# Patient Record
Sex: Male | Born: 1990 | Race: Black or African American | Hispanic: No | Marital: Single | Smoking: Former smoker
Health system: Southern US, Community
[De-identification: ages and names within clinical notes are randomized; demographics above are authoritative.]

## PROBLEM LIST (undated history)

## (undated) ENCOUNTER — Emergency Department (HOSPITAL_COMMUNITY): Admission: EM | Payer: Self-pay | Source: Home / Self Care

## (undated) DIAGNOSIS — S0990XA Unspecified injury of head, initial encounter: Secondary | ICD-10-CM

## (undated) DIAGNOSIS — R569 Unspecified convulsions: Secondary | ICD-10-CM

## (undated) DIAGNOSIS — I10 Essential (primary) hypertension: Secondary | ICD-10-CM

## (undated) DIAGNOSIS — J45909 Unspecified asthma, uncomplicated: Secondary | ICD-10-CM

---
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
## 2008-06-17 ENCOUNTER — Emergency Department (HOSPITAL_COMMUNITY): Admission: EM | Admit: 2008-06-17 | Discharge: 2008-06-17 | Payer: Self-pay | Admitting: Emergency Medicine

## 2009-09-06 ENCOUNTER — Emergency Department (HOSPITAL_COMMUNITY): Admission: EM | Admit: 2009-09-06 | Discharge: 2009-09-06 | Payer: Self-pay | Admitting: Emergency Medicine

## 2009-11-25 ENCOUNTER — Emergency Department (HOSPITAL_COMMUNITY): Admission: EM | Admit: 2009-11-25 | Discharge: 2009-11-25 | Payer: Self-pay | Admitting: Emergency Medicine

## 2010-01-21 ENCOUNTER — Emergency Department (HOSPITAL_COMMUNITY): Admission: EM | Admit: 2010-01-21 | Discharge: 2010-01-21 | Payer: Self-pay | Admitting: Emergency Medicine

## 2010-01-23 ENCOUNTER — Emergency Department (HOSPITAL_COMMUNITY): Admission: EM | Admit: 2010-01-23 | Discharge: 2010-01-23 | Payer: Self-pay | Admitting: Emergency Medicine

## 2010-03-15 ENCOUNTER — Emergency Department (HOSPITAL_COMMUNITY): Admission: EM | Admit: 2010-03-15 | Discharge: 2010-03-15 | Payer: Self-pay | Admitting: Emergency Medicine

## 2010-06-19 ENCOUNTER — Emergency Department (HOSPITAL_COMMUNITY): Admission: EM | Admit: 2010-06-19 | Discharge: 2010-06-20 | Payer: Self-pay | Admitting: Emergency Medicine

## 2011-01-17 LAB — GC/CHLAMYDIA PROBE AMP, GENITAL
Chlamydia, DNA Probe: NEGATIVE
GC Probe Amp, Genital: NEGATIVE

## 2012-02-09 ENCOUNTER — Encounter (HOSPITAL_COMMUNITY): Payer: Self-pay | Admitting: *Deleted

## 2012-02-09 ENCOUNTER — Emergency Department (INDEPENDENT_AMBULATORY_CARE_PROVIDER_SITE_OTHER)
Admission: EM | Admit: 2012-02-09 | Discharge: 2012-02-09 | Disposition: A | Discharge status: Home / Self Care | Payer: Self-pay | Source: Home / Self Care | Attending: Family Medicine | Admitting: Family Medicine

## 2012-02-09 DIAGNOSIS — M79609 Pain in unspecified limb: Secondary | ICD-10-CM

## 2012-02-09 DIAGNOSIS — M79606 Pain in leg, unspecified: Secondary | ICD-10-CM

## 2012-02-09 DIAGNOSIS — Z041 Encounter for examination and observation following transport accident: Secondary | ICD-10-CM

## 2012-02-09 MED ORDER — IBUPROFEN 800 MG PO TABS: 800.0000 mg | ORAL_TABLET | Freq: Three times a day (TID) | ORAL | Status: AC
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
## 2012-02-09 NOTE — Discharge Instructions (Signed)
Heat to thigh and medicine as needed, activity as tolerated. Return if needed.
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
## 2012-07-17 ENCOUNTER — Encounter (HOSPITAL_COMMUNITY): Payer: Self-pay

## 2012-07-17 ENCOUNTER — Emergency Department (INDEPENDENT_AMBULATORY_CARE_PROVIDER_SITE_OTHER)
Admission: EM | Admit: 2012-07-17 | Discharge: 2012-07-17 | Disposition: A | Discharge status: Home / Self Care | Payer: Self-pay | Source: Home / Self Care | Attending: Emergency Medicine | Admitting: Emergency Medicine

## 2012-07-17 DIAGNOSIS — K0510 Chronic gingivitis, plaque induced: Secondary | ICD-10-CM

## 2012-07-17 DIAGNOSIS — K0889 Other specified disorders of teeth and supporting structures: Secondary | ICD-10-CM

## 2012-07-17 DIAGNOSIS — K089 Disorder of teeth and supporting structures, unspecified: Secondary | ICD-10-CM

## 2012-07-17 MED ORDER — CLINDAMYCIN HCL 150 MG PO CAPS: 150.0000 mg | ORAL_CAPSULE | Freq: Three times a day (TID) | ORAL | Status: AC

## 2012-07-17 MED ORDER — KETOROLAC TROMETHAMINE 10 MG PO TABS: 10.0000 mg | ORAL_TABLET | Freq: Four times a day (QID) | ORAL | Status: DC | PRN

## 2012-07-17 NOTE — ED Provider Notes (Signed)
History     CSN: [id number]  Arrival date & time 07/17/12  1549   First MD Initiated Contact with Patient 07/17/12 1550      Chief Complaint  Patient presents with  . Dental Pain    (Consider location/radiation/quality/duration/timing/severity/associated sxs/prior treatment) HPI Comments: Patient presents urgent care complaining of toothache since yesterday. (Points right upper molar region). Describes he does not have a  dentist. Denies any facial swelling fevers or headaches. A throbbing type pain. Have not taking anything over-the-counter for it.  Patient is a 21 y.o. male presenting with tooth pain. The history is provided by the patient.  Dental PainThe primary symptoms include mouth pain. Primary symptoms do not include dental injury, fever, sore throat or angioedema. The symptoms began yesterday. The symptoms are worsening. The symptoms occur constantly.  Additional symptoms do not include: dental sensitivity to temperature, gum swelling, jaw pain, facial swelling, trouble swallowing, pain with swallowing, dry mouth, drooling, ear pain and fatigue. Medical issues include: periodontal disease. Medical issues do not include: cancer.    History reviewed. No pertinent past medical history.  History reviewed. No pertinent past surgical history.  History reviewed. No pertinent family history.  History  Substance Use Topics  . Smoking status: Current Every Day Smoker  . Smokeless tobacco: Not on file  . Alcohol Use: Yes      Review of Systems  Constitutional: Negative for fever, activity change, appetite change and fatigue.  HENT: Positive for dental problem. Negative for ear pain, sore throat, facial swelling, drooling, trouble swallowing, neck pain, neck stiffness and voice change.     Allergies  Ibuprofen; Penicillins; and Tylenol  Home Medications   Current Outpatient Rx  Name Route Sig Dispense Refill  . CLINDAMYCIN HCL 150 MG PO CAPS Oral Take 1 capsule (150 mg  total) by mouth 3 (three) times daily. 28 capsule 0  . KETOROLAC TROMETHAMINE 10 MG PO TABS Oral Take 1 tablet (10 mg total) by mouth every 6 (six) hours as needed for pain. 15 tablet 0    BP 135/84  Pulse 100  Temp 98.6 F (37 C) (Oral)  Resp 16  SpO2 98%  Physical Exam  Nursing note and vitals reviewed. Constitutional: He appears well-developed and well-nourished.  HENT:  Head: Normocephalic.  Mouth/Throat: Oropharynx is clear and moist and mucous membranes are normal. No oropharyngeal exudate, posterior oropharyngeal edema, posterior oropharyngeal erythema or tonsillar abscesses.  Eyes: Conjunctivae normal are normal.  Neck: Neck supple.    ED Course  Procedures (including critical care time)  Labs Reviewed - No data to display No results found.   1. Pain, dental   2. Gingivitis       MDM  Patient on exam exhibited signs of periodontal disease and gingivitis but no obvious apical abscess. Have referred him to see the dentist prescribed for a little along with clindamycin.       Jimmie Molly, MD 07/17/12 1721

## 2012-07-17 NOTE — ED Notes (Signed)
C/o tooth pain and headache on right side

## 2012-08-09 ENCOUNTER — Emergency Department (HOSPITAL_COMMUNITY)
Admission: EM | Admit: 2012-08-09 | Discharge: 2012-08-09 | Disposition: A | Discharge status: Home / Self Care | Payer: Self-pay | Attending: Emergency Medicine | Admitting: Emergency Medicine

## 2012-08-09 ENCOUNTER — Encounter (HOSPITAL_COMMUNITY): Payer: Self-pay | Admitting: *Deleted

## 2012-08-09 DIAGNOSIS — K0889 Other specified disorders of teeth and supporting structures: Secondary | ICD-10-CM

## 2012-08-09 DIAGNOSIS — F17200 Nicotine dependence, unspecified, uncomplicated: Secondary | ICD-10-CM | POA: Insufficient documentation

## 2012-08-09 DIAGNOSIS — R51 Headache: Secondary | ICD-10-CM | POA: Insufficient documentation

## 2012-08-09 DIAGNOSIS — K089 Disorder of teeth and supporting structures, unspecified: Secondary | ICD-10-CM | POA: Insufficient documentation

## 2012-08-09 DIAGNOSIS — H9209 Otalgia, unspecified ear: Secondary | ICD-10-CM | POA: Insufficient documentation

## 2012-08-09 MED ORDER — IBUPROFEN 600 MG PO TABS: 600.0000 mg | ORAL_TABLET | Freq: Four times a day (QID) | ORAL | Status: DC | PRN

## 2012-08-09 MED ORDER — HYDROCODONE-ACETAMINOPHEN 5-325 MG PO TABS
1.0000 | ORAL_TABLET | Freq: Once | ORAL | Status: AC
  Administered 2012-08-09: 1 via ORAL
  Filled 2012-08-09: qty 1

## 2012-08-09 MED ORDER — HYDROCODONE-ACETAMINOPHEN 5-325 MG PO TABS: ORAL_TABLET | ORAL | Status: DC

## 2012-08-09 MED ORDER — AMOXICILLIN 500 MG PO CAPS: 500.0000 mg | ORAL_CAPSULE | Freq: Three times a day (TID) | ORAL | Status: DC

## 2012-08-09 NOTE — ED Provider Notes (Signed)
History     CSN: [id number]  Arrival date & time 08/09/12  0006   First MD Initiated Contact with Patient 08/09/12 0028      Chief Complaint  Patient presents with  . Dental Pain    (Consider location/radiation/quality/duration/timing/severity/associated sxs/prior treatment) HPI Comments: Patient presents with complaint of right upper molar pain for the past 3-4 days. Patient states that the pain worsened yesterday. He has had similar pain over the past month. He was seen at urgent care and prescribed clindamycin which he was unable to fill 2/2 cost. Patient has a penicillin allergy but has taken amoxicillin in the past without problem. He denies facial swelling or fever. No neck pain, swelling, trouble swallowing. Onset gradual. Course is constant. Patient has taken ibuprofen which has helped temporarily. Nothing makes symptoms worse. Pain radiates to right ear.  Patient is a 21 y.o. male presenting with tooth pain.  Dental PainThe primary symptoms include headaches. Primary symptoms do not include fever, shortness of breath, sore throat or cough.  Additional symptoms include: ear pain. Additional symptoms do not include: facial swelling and trouble swallowing.    History reviewed. No pertinent past medical history.  History reviewed. No pertinent past surgical history.  History reviewed. No pertinent family history.  History  Substance Use Topics  . Smoking status: Current Every Day Smoker    Types: Cigarettes  . Smokeless tobacco: Not on file  . Alcohol Use: Yes      Review of Systems  Constitutional: Negative for fever.  HENT: Positive for ear pain and dental problem. Negative for sore throat, facial swelling, rhinorrhea, trouble swallowing and neck pain.   Respiratory: Negative for cough, shortness of breath and stridor.   Gastrointestinal: Negative for nausea and vomiting.  Skin: Negative for color change and rash.  Neurological: Positive for headaches.     Allergies  Penicillins  Home Medications   Current Outpatient Rx  Name Route Sig Dispense Refill  . ACETAMINOPHEN 500 MG PO TABS Oral Take 1,000 mg by mouth every 6 (six) hours as needed. For pain    . AMOXICILLIN 500 MG PO CAPS Oral Take 1 capsule (500 mg total) by mouth 3 (three) times daily. 21 capsule 0  . HYDROCODONE-ACETAMINOPHEN 5-325 MG PO TABS  Take 1-2 tablets every 6 hours as needed for severe pain 12 tablet 0  . IBUPROFEN 600 MG PO TABS Oral Take 1 tablet (600 mg total) by mouth every 6 (six) hours as needed for pain. 20 tablet 0    BP 148/80  Pulse 83  Temp 97.9 F (36.6 C) (Oral)  Resp 20  SpO2 99%  Physical Exam  Nursing note and vitals reviewed. Constitutional: He appears well-developed and well-nourished.  HENT:  Head: Normocephalic and atraumatic. No trismus in the jaw.  Right Ear: Tympanic membrane, external ear and ear canal normal.  Left Ear: Tympanic membrane, external ear and ear canal normal.  Nose: Nose normal.  Mouth/Throat: Uvula is midline, oropharynx is clear and moist and mucous membranes are normal. Abnormal dentition. Dental caries present. No dental abscesses or uvula swelling. No tonsillar abscesses.         Significant periodontal disease with multiple caries. No swelling or erythema noted on exam.  Eyes: Pupils are equal, round, and reactive to light.  Neck: Normal range of motion. Neck supple.       No neck swelling or Lugwig's angina  Neurological: He is alert.  Skin: Skin is warm and dry.  Psychiatric: He has a  normal mood and affect.    ED Course  Procedures (including critical care time)  Labs Reviewed - No data to display No results found.   1. Pain, dental    12:59 AM Patient seen and examined. Work-up initiated. Medications ordered.   Vital signs reviewed and are as follows: Filed Vitals:   08/09/12 0010  BP: 148/80  Pulse: 83  Temp: 97.9 F (36.6 C)  Resp: 20    Patient counseled to take prescribed  medications as directed, return with worsening facial or neck swelling, and to follow-up with their dentist as soon as possible.   Patient counseled on use of narcotic pain medications. Counseled not to combine these medications with others containing tylenol. Urged not to drink alcohol, drive, or perform any other activities that requires focus while taking these medications. The patient verbalizes understanding and agrees with the plan.    MDM  Patient with toothache.  No gross abscess.  Exam unconcerning for Ludwig's angina or other deep tissue infection in neck.  Will treat with penicillin and pain medicine.  Urged patient to follow-up with dentist.           Renne Crigler, PA 08/09/12 [phone]

## 2012-08-09 NOTE — ED Provider Notes (Signed)
Medical screening examination/treatment/procedure(s) were performed by non-physician practitioner and as supervising physician I was immediately available for consultation/collaboration.  Miasia Crabtree, MD 08/09/12 0254 

## 2012-08-09 NOTE — ED Notes (Signed)
Pt c/o dental pain in upper right jaw x 1 week, worsening last night at about 2000, denies relief from OTC tylenol.

## 2013-03-03 ENCOUNTER — Encounter (HOSPITAL_COMMUNITY): Payer: Self-pay

## 2013-03-03 ENCOUNTER — Emergency Department (HOSPITAL_COMMUNITY)
Admission: EM | Admit: 2013-03-03 | Discharge: 2013-03-03 | Disposition: A | Discharge status: Home / Self Care | Payer: Self-pay | Attending: Emergency Medicine | Admitting: Emergency Medicine

## 2013-03-03 DIAGNOSIS — K0889 Other specified disorders of teeth and supporting structures: Secondary | ICD-10-CM

## 2013-03-03 DIAGNOSIS — K089 Disorder of teeth and supporting structures, unspecified: Secondary | ICD-10-CM | POA: Insufficient documentation

## 2013-03-03 DIAGNOSIS — F17200 Nicotine dependence, unspecified, uncomplicated: Secondary | ICD-10-CM | POA: Insufficient documentation

## 2013-03-03 MED ORDER — HYDROCODONE-ACETAMINOPHEN 5-325 MG PO TABS: 1.0000 | ORAL_TABLET | Freq: Four times a day (QID) | ORAL | Status: DC | PRN

## 2013-03-03 MED ORDER — CLINDAMYCIN HCL 300 MG PO CAPS
300.0000 mg | ORAL_CAPSULE | Freq: Once | ORAL | Status: AC
  Administered 2013-03-03: 300 mg via ORAL
  Filled 2013-03-03: qty 1

## 2013-03-03 MED ORDER — CLINDAMYCIN HCL 150 MG PO CAPS: 150.0000 mg | ORAL_CAPSULE | Freq: Three times a day (TID) | ORAL | Status: DC

## 2013-03-03 MED ORDER — HYDROCODONE-ACETAMINOPHEN 5-325 MG PO TABS
1.0000 | ORAL_TABLET | Freq: Once | ORAL | Status: AC
  Administered 2013-03-03: 1 via ORAL
  Filled 2013-03-03: qty 1

## 2013-03-03 NOTE — ED Provider Notes (Signed)
History     CSN: [id number]  Arrival date & time 03/03/13  0157   First MD Initiated Contact with Patient 03/03/13 0204      Chief Complaint  Patient presents with  . Dental Pain    (Consider location/radiation/quality/duration/timing/severity/associated sxs/prior treatment) HPI Is a 22 year old male with a two-day history of pain in his right maxilla. He attributes this to her dental problems although he cannot point to a specific tooth. He says it hurts to chew on that side shows been eating primarily on the left side of his mouth. The pain is radiating to his right temple and a throbbing. The pain is moderate to severe, worse lying down. He denies cervical lymphadenopathy. He denies fever. He does not have a dentist.  History reviewed. No pertinent past medical history.  History reviewed. No pertinent past surgical history.  No family history on file.  History  Substance Use Topics  . Smoking status: Current Every Day Smoker    Types: Cigarettes  . Smokeless tobacco: Not on file  . Alcohol Use: Yes      Review of Systems  All other systems reviewed and are negative.    Allergies  Penicillins  Home Medications   Current Outpatient Rx  Name  Route  Sig  Dispense  Refill  . acetaminophen (TYLENOL) 500 MG tablet   Oral   Take 1,000 mg by mouth every 6 (six) hours as needed. For pain         . ibuprofen (ADVIL,MOTRIN) 200 MG tablet   Oral   Take 400 mg by mouth every 6 (six) hours as needed for pain.           BP 138/78  Pulse 90  Temp(Src) 97.9 F (36.6 C) (Oral)  Resp 18  Ht 5\' 7"  (1.702 m)  Wt 245 lb (111.131 kg)  BMI 38.36 kg/m2  SpO2 98%  Physical Exam General: Well-developed, well-nourished male in no acute distress; appearance consistent with age of record HENT: normocephalic, atraumatic; multiple dental caries; right maxilla tender to percussion without edema Eyes: pupils equal round and reactive to light; extraocular muscles  intact Neck: supple; no cervical lymphadenopathy Heart: regular rate and rhythm Lungs: Normal respiratory effort and excursion Abdomen: soft; nondistended Extremities: No deformity; full range of motion Neurologic: Awake, alert and oriented; motor function intact in all extremities and symmetric; no facial droop Skin: Warm and dry Psychiatric: Normal mood and affect    ED Course  Procedures (including critical care time)     MDM  Patient advised that he needs to contact his dentist on call within 48 hours.        Hanley Seamen, MD 03/03/13 [phone]

## 2013-03-03 NOTE — Discharge Instructions (Signed)

## 2013-03-03 NOTE — ED Notes (Signed)
Pt /o of R side dental pain that has gotten worse over the last 2 days. Pain moves to the R side of the head causing head to throb. No dental visit in over a year.

## 2013-04-02 ENCOUNTER — Encounter (HOSPITAL_COMMUNITY): Payer: Self-pay | Admitting: *Deleted

## 2013-04-02 ENCOUNTER — Emergency Department (HOSPITAL_COMMUNITY)
Admission: EM | Admit: 2013-04-02 | Discharge: 2013-04-02 | Disposition: A | Discharge status: Home / Self Care | Payer: Self-pay | Attending: Emergency Medicine | Admitting: Emergency Medicine

## 2013-04-02 DIAGNOSIS — F17200 Nicotine dependence, unspecified, uncomplicated: Secondary | ICD-10-CM | POA: Insufficient documentation

## 2013-04-02 DIAGNOSIS — A630 Anogenital (venereal) warts: Secondary | ICD-10-CM | POA: Insufficient documentation

## 2013-04-02 DIAGNOSIS — Z880 Allergy status to penicillin: Secondary | ICD-10-CM | POA: Insufficient documentation

## 2013-04-02 DIAGNOSIS — L293 Anogenital pruritus, unspecified: Secondary | ICD-10-CM | POA: Insufficient documentation

## 2013-04-02 DIAGNOSIS — Z202 Contact with and (suspected) exposure to infections with a predominantly sexual mode of transmission: Secondary | ICD-10-CM | POA: Insufficient documentation

## 2013-04-02 MED ORDER — METRONIDAZOLE 500 MG PO TABS
2000.0000 mg | ORAL_TABLET | Freq: Once | ORAL | Status: AC
  Administered 2013-04-02: 2000 mg via ORAL
  Filled 2013-04-02: qty 4

## 2013-04-02 NOTE — ED Provider Notes (Signed)
History    This chart was scribed for Spencer Davis, non-physician practitioner working with Flint Melter, MD by Leone Payor, ED Scribe. This patient was seen in room TR11C/TR11C and the patient's care was started at 1930.   CSN: [id number]  Arrival date & time 04/02/13  7829   First MD Initiated Contact with Patient 04/02/13 1938      Chief Complaint  Patient presents with  . Exposure to STD     The history is provided by the patient. No language interpreter was used.    HPI Comments: Spencer Davis is a 22 y.o. male who presents to the Emergency Department requesting to be checked for a possible STD. States his girlfriend recently told him she was diagnosed with trichomonas. Per pt, girlfriend stated she did not have any other sexual partners so pt wants to be evaluated. Pt states his current girlfriend is his second sexual partner. Reports he used a condom with her the first time they had intercourse but has not used any since then with her. He has noticed some penile itching. He denies penile discharge, dysuria, testicular pain, penile pain, nausea, vomiting, abdominal pain, fevers, chills, sweating.    History reviewed. No pertinent past medical history.  History reviewed. No pertinent past surgical history.  History reviewed. No pertinent family history.  History  Substance Use Topics  . Smoking status: Current Every Day Smoker    Types: Cigarettes  . Smokeless tobacco: Not on file  . Alcohol Use: Yes      Review of Systems  Constitutional: Negative for fever, chills and diaphoresis.  Gastrointestinal: Negative for nausea and vomiting.  Genitourinary: Negative for dysuria, discharge, penile swelling, scrotal swelling, penile pain and testicular pain.    Allergies  Penicillins  Home Medications   Current Outpatient Rx  Name  Route  Sig  Dispense  Refill  . ibuprofen (ADVIL,MOTRIN) 200 MG tablet   Oral   Take 400 mg by mouth every 6 (six) hours as needed for  pain.           BP 160/104  Pulse 88  Temp(Src) 99.3 F (37.4 C) (Oral)  Resp 18  SpO2 99%  Physical Exam  Nursing note and vitals reviewed. Constitutional: He appears well-developed and well-nourished. No distress.  HENT:  Head: Normocephalic and atraumatic.  Neck: Neck supple.  Pulmonary/Chest: Effort normal.  Abdominal: Soft. He exhibits no distension. There is no tenderness. There is no rebound and no guarding.  Genitourinary: Testes normal. Right testis shows no mass, no swelling and no tenderness. Right testis is descended. Left testis shows no mass, no swelling and no tenderness. Left testis is descended. Circumcised. No penile tenderness.  Neurological: He is alert.  Skin: He is not diaphoretic.    ED Course  Procedures (including critical care time)  DIAGNOSTIC STUDIES: Oxygen Saturation is 99% on RA, normal by my interpretation.    COORDINATION OF CARE: 9:22 PM Discussed treatment plan with pt at bedside and pt agreed to plan.   Labs Reviewed  GC/CHLAMYDIA PROBE AMP  RPR   No results found.   1. Exposure to STD   2. Genital warts      MDM  Pt with exposure to trichomonas, also found to have genital warts on exam. Pt educated about HPV.  Pt decided to have full STD testing including GC/Chlam, syphilis testing.   Pt is hypertensive but denies CP, SOB, HA.  No AMS.  Now 152/86.  Treated for trichomonas.  Discussed findings,  treatment, follow up  with patient.  Pt given return precautions.  Pt verbalizes understanding and agrees with plan.        I personally performed the services described in this documentation, which was scribed in my presence. The recorded information has been reviewed and is accurate.   Spencer Dredge, PA-C 04/02/13 2255
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
## 2013-04-03 LAB — GC/CHLAMYDIA PROBE AMP: GC Probe RNA: NEGATIVE

## 2013-04-03 LAB — RPR: RPR Ser Ql: NONREACTIVE

## 2013-04-03 NOTE — ED Provider Notes (Signed)
Medical screening examination/treatment/procedure(s) were performed by non-physician practitioner and as supervising physician I was immediately available for consultation/collaboration.  Dhruti Ghuman L Shaila Gilchrest, MD 04/03/13 0048 

## 2013-07-04 ENCOUNTER — Encounter (HOSPITAL_COMMUNITY): Payer: Self-pay | Admitting: Emergency Medicine

## 2013-07-04 ENCOUNTER — Emergency Department (HOSPITAL_COMMUNITY)
Admission: EM | Admit: 2013-07-04 | Discharge: 2013-07-04 | Disposition: A | Discharge status: Home / Self Care | Payer: Self-pay | Attending: Emergency Medicine | Admitting: Emergency Medicine

## 2013-07-04 DIAGNOSIS — K029 Dental caries, unspecified: Secondary | ICD-10-CM | POA: Insufficient documentation

## 2013-07-04 DIAGNOSIS — F17200 Nicotine dependence, unspecified, uncomplicated: Secondary | ICD-10-CM | POA: Insufficient documentation

## 2013-07-04 MED ORDER — TRAMADOL HCL 50 MG PO TABS
50.0000 mg | ORAL_TABLET | Freq: Once | ORAL | Status: AC
  Administered 2013-07-04: 50 mg via ORAL
  Filled 2013-07-04: qty 1

## 2013-07-04 MED ORDER — TRAMADOL HCL 50 MG PO TABS: 50.0000 mg | ORAL_TABLET | Freq: Four times a day (QID) | ORAL | Status: DC | PRN

## 2013-07-04 NOTE — ED Provider Notes (Signed)
Medical screening examination/treatment/procedure(s) were performed by non-physician practitioner and as supervising physician I was immediately available for consultation/collaboration.   Daiton Cowles M Osa Campoli, DO 07/04/13 0835 

## 2013-07-04 NOTE — ED Provider Notes (Signed)
CSN: [id number]     Arrival date & time 07/04/13  0119 History   First MD Initiated Contact with Patient 07/04/13 0221     Chief Complaint  Patient presents with  . Dental Pain   (Consider location/radiation/quality/duration/timing/severity/associated sxs/prior Treatment) HPI Comments: Patient reports, that he's had gradual onset of worsening.  Dental pain.  He has large cavities in his right upper and lower second molar without surrounding gum  Patient is a 22 y.o. male presenting with tooth pain. The history is provided by the patient.  Dental Pain Location:  Upper and lower Upper teeth location:  2/RU 2nd molar Lower teeth location:  31/RL 2nd molar Quality:  Aching Severity:  Moderate Onset quality:  Gradual Timing:  Constant Progression:  Worsening Chronicity:  New Relieved by:  None tried Worsened by:  Cold food/drink Ineffective treatments:  None tried Associated symptoms: no drooling, no facial pain, no facial swelling, no fever, no neck swelling, no oral lesions and no trismus     History reviewed. No pertinent past medical history. History reviewed. No pertinent past surgical history. No family history on file. History  Substance Use Topics  . Smoking status: Current Every Day Smoker    Types: Cigarettes  . Smokeless tobacco: Not on file  . Alcohol Use: Yes    Review of Systems  Constitutional: Negative for fever.  HENT: Positive for dental problem. Negative for facial swelling, drooling and mouth sores.   All other systems reviewed and are negative.    Allergies  Penicillins  Home Medications   Current Outpatient Rx  Name  Route  Sig  Dispense  Refill  . ibuprofen (ADVIL,MOTRIN) 200 MG tablet   Oral   Take 400 mg by mouth every 6 (six) hours as needed for pain.         . traMADol (ULTRAM) 50 MG tablet   Oral   Take 1 tablet (50 mg total) by mouth every 6 (six) hours as needed for pain.   15 tablet   0    BP 127/95  Pulse 96  Temp(Src) 98.3  F (36.8 C) (Oral)  Resp 16  SpO2 99% Physical Exam  Nursing note and vitals reviewed. Constitutional: He is oriented to person, place, and time. He appears well-developed and well-nourished.  HENT:  Head: Normocephalic and atraumatic.  Right Ear: External ear normal.  Left Ear: External ear normal.  Mouth/Throat: Uvula is midline and oropharynx is clear and moist.    Neck: Normal range of motion.  Cardiovascular: Normal rate and regular rhythm.   Pulmonary/Chest: Effort normal and breath sounds normal.  Abdominal: Soft.  Musculoskeletal: Normal range of motion.  Lymphadenopathy:    He has no cervical adenopathy.  Neurological: He is alert and oriented to person, place, and time.  Skin: No rash noted.    ED Course  Procedures (including critical care time) Labs Review Labs Reviewed - No data to display Imaging Review No results found.  MDM   1. Dental cavity    Will treat with Ultram, and refer patient to the free clinic, but will be held and [facility], September 26 and 27th    Arman Filter, NP 07/04/13 0235  Arman Filter, NP 07/04/13 0236

## 2013-07-04 NOTE — ED Notes (Signed)
Pt. reports progressing right upper molar pain for 1 week unrelieved by OTC Ibuprofen .

## 2013-07-06 ENCOUNTER — Encounter (HOSPITAL_COMMUNITY): Payer: Self-pay | Admitting: *Deleted

## 2013-07-06 ENCOUNTER — Emergency Department (HOSPITAL_COMMUNITY)
Admission: EM | Admit: 2013-07-06 | Discharge: 2013-07-06 | Disposition: A | Discharge status: Home / Self Care | Payer: Self-pay | Attending: Emergency Medicine | Admitting: Emergency Medicine

## 2013-07-06 DIAGNOSIS — F17200 Nicotine dependence, unspecified, uncomplicated: Secondary | ICD-10-CM | POA: Insufficient documentation

## 2013-07-06 DIAGNOSIS — Z880 Allergy status to penicillin: Secondary | ICD-10-CM | POA: Insufficient documentation

## 2013-07-06 DIAGNOSIS — E669 Obesity, unspecified: Secondary | ICD-10-CM | POA: Insufficient documentation

## 2013-07-06 DIAGNOSIS — K089 Disorder of teeth and supporting structures, unspecified: Secondary | ICD-10-CM | POA: Insufficient documentation

## 2013-07-06 DIAGNOSIS — Z792 Long term (current) use of antibiotics: Secondary | ICD-10-CM | POA: Insufficient documentation

## 2013-07-06 DIAGNOSIS — K0889 Other specified disorders of teeth and supporting structures: Secondary | ICD-10-CM

## 2013-07-06 MED ORDER — METRONIDAZOLE 500 MG PO TABS: 500.0000 mg | ORAL_TABLET | Freq: Two times a day (BID) | ORAL | Status: DC

## 2013-07-06 MED ORDER — MORPHINE SULFATE 4 MG/ML IJ SOLN
4.0000 mg | Freq: Once | INTRAMUSCULAR | Status: AC
  Administered 2013-07-06: 4 mg via INTRAMUSCULAR
  Filled 2013-07-06: qty 1

## 2013-07-06 MED ORDER — OXYCODONE-ACETAMINOPHEN 5-325 MG PO TABS: ORAL_TABLET | ORAL | Status: DC

## 2013-07-06 NOTE — ED Notes (Signed)
Pt reports seen at [facility] on 9/20 for same c/o of right upper tooth pain 10/10. Reports rx tramadol, but with no pain relief. Reports right sided earache and head pain now.

## 2013-07-06 NOTE — ED Notes (Signed)
Bed: WA01 Expected date:  Expected time:  Means of arrival:  Comments: 

## 2013-07-06 NOTE — Progress Notes (Signed)
[facility] provided pt with a list of primary care resources, dental resources, and information on the free dental clinic at the end of the month.

## 2013-07-06 NOTE — ED Provider Notes (Signed)
CSN: [id number]     Arrival date & time 07/06/13  1050 History   First MD Initiated Contact with Patient 07/06/13 1112     Chief Complaint  Patient presents with  . Dental Pain   (Consider location/radiation/quality/duration/timing/severity/associated sxs/prior Treatment) HPI  Spencer Davis is a 22 y.o. male complaining of pain to right tooth, cannot localize this is upper lower. Pain is severe, 10 out of 10, exacerbated by chewing. Patient denies fever, trismus, nausea vomiting, facial swelling, gum swelling or discharge.  History reviewed. No pertinent past medical history. History reviewed. No pertinent past surgical history. History reviewed. No pertinent family history. History  Substance Use Topics  . Smoking status: Current Every Day Smoker    Types: Cigarettes  . Smokeless tobacco: Not on file  . Alcohol Use: Yes    Review of Systems  Allergies  Penicillins  Home Medications   Current Outpatient Rx  Name  Route  Sig  Dispense  Refill  . ibuprofen (ADVIL,MOTRIN) 200 MG tablet   Oral   Take 200-400 mg by mouth every 6 (six) hours as needed for pain.          . traMADol (ULTRAM) 50 MG tablet   Oral   Take 1 tablet (50 mg total) by mouth every 6 (six) hours as needed for pain.   15 tablet   0   . metroNIDAZOLE (FLAGYL) 500 MG tablet   Oral   Take 1 tablet (500 mg total) by mouth 2 (two) times daily. One tab PO bid x 10 days   20 tablet   0   . oxyCODONE-acetaminophen (PERCOCET/ROXICET) 5-325 MG per tablet      1 to 2 tabs PO q6hrs  PRN for pain   15 tablet   0    BP 156/83  Pulse 92  Temp(Src) 97.6 F (36.4 C) (Oral)  Resp 20  SpO2 100% Physical Exam  Nursing note and vitals reviewed. Constitutional: He is oriented to person, place, and time. He appears well-developed and well-nourished. No distress.  Obese, crying, many facial tattoos, left-sided ankle monitor  HENT:  Head: Normocephalic and atraumatic.  Eyes: Conjunctivae and EOM are normal.  Pupils are equal, round, and reactive to light.  Neck: Normal range of motion.  Cardiovascular: Normal rate, regular rhythm and intact distal pulses.   Pulmonary/Chest: Effort normal and breath sounds normal. No stridor. No respiratory distress. He has no wheezes. He has no rales. He exhibits no tenderness.  Abdominal: Soft. Bowel sounds are normal. He exhibits no distension and no mass. There is no tenderness. There is no rebound and no guarding.  Musculoskeletal: Normal range of motion. He exhibits no edema.  Neurological: He is alert and oriented to person, place, and time.  Psychiatric: He has a normal mood and affect.    ED Course  Procedures (including critical care time) Labs Review Labs Reviewed - No data to display Imaging Review No results found.  MDM   1. Pain, dental    Filed Vitals:   07/06/13 1059 07/06/13 1114 07/06/13 1201  BP: 149/113 141/106 156/83  Pulse: 94 95 92  Temp: 98.1 F (36.7 C) 97.6 F (36.4 C)   TempSrc: Oral Oral   Resp: 16 18 20   SpO2: 100% 100%      Spencer Davis is a 22 y.o. male Patient with toothache.  No gross abscess.  Exam unconcerning for Ludwig's angina or spread of infection.  Will treat with penicillin and pain medicine.  Urged patient to  follow-up with dentist.    Medications  morphine 4 MG/ML injection 4 mg (4 mg Intramuscular Given 07/06/13 1159)   Pt is hemodynamically stable, appropriate for, and amenable to discharge at this time. Pt verbalized understanding and agrees with care plan. All questions answered. Outpatient follow-up and specific return precautions discussed.    Discharge Medication List as of 07/06/2013 11:42 AM    START taking these medications   Details  metroNIDAZOLE (FLAGYL) 500 MG tablet Take 1 tablet (500 mg total) by mouth 2 (two) times daily. One tab PO bid x 10 days, Starting 07/06/2013, Until Discontinued, Print    oxyCODONE-acetaminophen (PERCOCET/ROXICET) 5-325 MG per tablet 1 to 2 tabs PO q6hrs  PRN  for pain, Print        Note: Portions of this report may have been transcribed using voice recognition software. Every effort was made to ensure accuracy; however, inadvertent computerized transcription errors may be present      Wynetta Emery, PA-C 07/06/13 1647

## 2013-07-07 NOTE — ED Provider Notes (Signed)
Medical screening examination/treatment/procedure(s) were performed by non-physician practitioner and as supervising physician I was immediately available for consultation/collaboration.  Destenie Ingber T Jayanth Szczesniak, MD 07/07/13 0709 

## 2016-09-18 ENCOUNTER — Emergency Department (HOSPITAL_COMMUNITY)
Admission: EM | Admit: 2016-09-18 | Discharge: 2016-09-19 | Disposition: A | Discharge status: Home / Self Care | Payer: Self-pay | Attending: Emergency Medicine | Admitting: Emergency Medicine

## 2016-09-18 ENCOUNTER — Encounter (HOSPITAL_COMMUNITY): Payer: Self-pay | Admitting: Emergency Medicine

## 2016-09-18 DIAGNOSIS — R197 Diarrhea, unspecified: Secondary | ICD-10-CM

## 2016-09-18 DIAGNOSIS — R112 Nausea with vomiting, unspecified: Secondary | ICD-10-CM | POA: Insufficient documentation

## 2016-09-18 DIAGNOSIS — F17210 Nicotine dependence, cigarettes, uncomplicated: Secondary | ICD-10-CM | POA: Insufficient documentation

## 2016-09-18 DIAGNOSIS — J45909 Unspecified asthma, uncomplicated: Secondary | ICD-10-CM | POA: Insufficient documentation

## 2016-09-18 DIAGNOSIS — I10 Essential (primary) hypertension: Secondary | ICD-10-CM | POA: Insufficient documentation

## 2016-09-18 HISTORY — DX: Unspecified asthma, uncomplicated: J45.909

## 2016-09-18 HISTORY — DX: Essential (primary) hypertension: I10

## 2016-09-18 LAB — URINALYSIS, ROUTINE W REFLEX MICROSCOPIC
BILIRUBIN URINE: NEGATIVE
GLUCOSE, UA: NEGATIVE mg/dL
Hgb urine dipstick: NEGATIVE
KETONES UR: NEGATIVE mg/dL
Nitrite: NEGATIVE
PH: 6 (ref 5.0–8.0)
Protein, ur: NEGATIVE mg/dL
SPECIFIC GRAVITY, URINE: 1.02 (ref 1.005–1.030)
Squamous Epithelial / LPF: NONE SEEN

## 2016-09-18 LAB — COMPREHENSIVE METABOLIC PANEL
ALK PHOS: 74 U/L (ref 38–126)
ALT: 36 U/L (ref 17–63)
AST: 32 U/L (ref 15–41)
Albumin: 4 g/dL (ref 3.5–5.0)
Anion gap: 14 (ref 5–15)
BILIRUBIN TOTAL: 0.8 mg/dL (ref 0.3–1.2)
BUN: 10 mg/dL (ref 6–20)
CALCIUM: 9.7 mg/dL (ref 8.9–10.3)
CO2: 22 mmol/L (ref 22–32)
CREATININE: 0.86 mg/dL (ref 0.61–1.24)
Chloride: 105 mmol/L (ref 101–111)
Glucose, Bld: 116 mg/dL — ABNORMAL HIGH (ref 65–99)
Potassium: 4 mmol/L (ref 3.5–5.1)
Sodium: 141 mmol/L (ref 135–145)
Total Protein: 6.9 g/dL (ref 6.5–8.1)

## 2016-09-18 LAB — CBC
HCT: 45.1 % (ref 39.0–52.0)
Hemoglobin: 15.7 g/dL (ref 13.0–17.0)
MCH: 30.4 pg (ref 26.0–34.0)
MCHC: 34.8 g/dL (ref 30.0–36.0)
MCV: 87.4 fL (ref 78.0–100.0)
PLATELETS: 235 10*3/uL (ref 150–400)
RBC: 5.16 MIL/uL (ref 4.22–5.81)
RDW: 12.9 % (ref 11.5–15.5)
WBC: 7.8 10*3/uL (ref 4.0–10.5)

## 2016-09-18 LAB — LIPASE, BLOOD: Lipase: 52 U/L — ABNORMAL HIGH (ref 11–51)

## 2016-09-18 NOTE — ED Provider Notes (Signed)
[facility]-EMERGENCY DEPT Provider Note   CSN: [id number] Arrival date & time: 09/18/16  1909  By signing my name below, I, Arianna Nassar, attest that this documentation has been prepared under the direction and in the presence of Shon Batonourtney F Maciah Schweigert, MD.  Electronically Signed: Octavia HeirArianna Nassar, ED Scribe. 09/19/16. 12:39 AM.    History   Chief Complaint Chief Complaint  Patient presents with  . Generalized Body Aches  . Emesis    The history is provided by the patient. No language interpreter was used.   HPI Comments: Spencer Davis is a 25 y.o. male who has a PMhx of HTN presents to the Emergency Department complaining of moderate, gradual worsening generalized body aches x 3 days. He reports associated nausea, vomiting x 3 days, sharp, 6/10 epigastric abdominal pain and mild diarrhea. He notes feeling hot and then having chills intermittently. Pt says that he vomits after eating and his abdominal pain is worse after vomiting. Pt has been taking Nyquil to relieve his symptoms without any relief. He reports having HTN and has not been on medication in the past year. Pt denies hematemesis or blood in stool. Pt is a smoker. He further denies hx of DM or HLD.  Past Medical History:  Diagnosis Date  . Asthma   . Hypertension     There are no active problems to display for this patient.   History reviewed. No pertinent surgical history.     Home Medications    Prior to Admission medications   Medication Sig Start Date End Date Taking? Authorizing Provider  loperamide (IMODIUM) 2 MG capsule Take 1 capsule (2 mg total) by mouth 4 (four) times daily as needed for diarrhea or loose stools. 09/19/16   Shon Batonourtney F Cresencio Reesor, MD  metroNIDAZOLE (FLAGYL) 500 MG tablet Take 1 tablet (500 mg total) by mouth 2 (two) times daily. One tab PO bid x 10 days Patient not taking: Reported on 09/19/2016 07/06/13   Joni ReiningNicole Pisciotta, PA-C  ondansetron (ZOFRAN ODT) 4 MG disintegrating tablet Take 1 tablet (4 mg  total) by mouth every 8 (eight) hours as needed for nausea or vomiting. 09/19/16   Shon Batonourtney F Zaylon Bossier, MD  oxyCODONE-acetaminophen (PERCOCET/ROXICET) 5-325 MG per tablet 1 to 2 tabs PO q6hrs  PRN for pain Patient not taking: Reported on 09/19/2016 07/06/13   Joni ReiningNicole Pisciotta, PA-C  traMADol (ULTRAM) 50 MG tablet Take 1 tablet (50 mg total) by mouth every 6 (six) hours as needed for pain. Patient not taking: Reported on 09/19/2016 07/04/13   Earley FavorGail Schulz, NP    Family History History reviewed. No pertinent family history.  Social History Social History  Substance Use Topics  . Smoking status: Current Every Day Smoker    Packs/day: 0.25    Types: Cigarettes  . Smokeless tobacco: Never Used  . Alcohol use Yes     Allergies   Penicillins   Review of Systems Review of Systems  Constitutional: Positive for chills. Negative for fever.  HENT: Positive for congestion.   Gastrointestinal: Positive for abdominal pain, diarrhea, nausea and vomiting. Negative for blood in stool.  Musculoskeletal: Positive for myalgias.  All other systems reviewed and are negative.    Physical Exam Updated Vital Signs BP 108/64   Pulse 68   Temp 98.9 F (37.2 C) (Oral)   Resp 18   Ht 5\' 7"  (1.702 m)   Wt 262 lb 8 oz (119.1 kg)   SpO2 100%   BMI 41.11 kg/m   Physical Exam  Constitutional: He is  oriented to person, place, and time. He appears well-developed and well-nourished.  HENT:  Head: Normocephalic and atraumatic.  Mouth/Throat: Oropharynx is clear and moist.  Eyes: Pupils are equal, round, and reactive to light.  Neck: Normal range of motion. Neck supple.  Cardiovascular: Normal rate, regular rhythm and normal heart sounds.   No murmur heard. Pulmonary/Chest: Effort normal and breath sounds normal. No respiratory distress. He has no wheezes.  Abdominal: Soft. Bowel sounds are normal. There is no tenderness. There is no rebound.  Musculoskeletal: He exhibits no edema.  Neurological: He is  alert and oriented to person, place, and time.  Skin: Skin is warm and dry.  Psychiatric: He has a normal mood and affect.  Nursing note and vitals reviewed.    ED Treatments / Results  DIAGNOSTIC STUDIES: Oxygen Saturation is 98% on RA, normal by my interpretation.  COORDINATION OF Davis:  12:38 AM Discussed treatment plan with pt at bedside and pt agreed to plan.  Labs (all labs ordered are listed, but only abnormal results are displayed) Labs Reviewed  LIPASE, BLOOD - Abnormal; Notable for the following:       Result Value   Lipase 52 (*)    All other components within normal limits  COMPREHENSIVE METABOLIC PANEL - Abnormal; Notable for the following:    Glucose, Bld 116 (*)    All other components within normal limits  URINALYSIS, ROUTINE W REFLEX MICROSCOPIC - Abnormal; Notable for the following:    Leukocytes, UA TRACE (*)    Bacteria, UA RARE (*)    All other components within normal limits  CBC    EKG  EKG Interpretation None       Radiology No results found.  Procedures Procedures (including critical Davis time)  Medications Ordered in ED Medications  sodium chloride 0.9 % bolus 1,000 mL (1,000 mLs Intravenous New Bag/Given 09/19/16 0108)  ondansetron (ZOFRAN) injection 4 mg (4 mg Intravenous Given 09/19/16 0108)     Initial Impression / Assessment and Plan / ED Course  I have reviewed the triage vital signs and the nursing notes.  Pertinent labs & imaging results that were available during my Davis of the patient were reviewed by me and considered in my medical decision making (see chart for details).  Clinical Course     Patient presents with nausea, vomiting, diarrhea. Nontoxic on exam. Vital signs reassuring. Lab work normal. No signs of dehydration. Physical exam is benign. Suspect viral etiology. Patient given fluids and Zofran. Able to tolerate fluids by mouth. Symptom control at home with Zofran and Imodium.  After history, exam, and medical  workup I feel the patient has been appropriately medically screened and is safe for discharge home. Pertinent diagnoses were discussed with the patient. Patient was given return precautions.  I personally performed the services described in this documentation, which was scribed in my presence. The recorded information has been reviewed and is accurate.  Final Clinical Impressions(s) / ED Diagnoses   Final diagnoses:  Nausea vomiting and diarrhea    New Prescriptions New Prescriptions   LOPERAMIDE (IMODIUM) 2 MG CAPSULE    Take 1 capsule (2 mg total) by mouth 4 (four) times daily as needed for diarrhea or loose stools.   ONDANSETRON (ZOFRAN ODT) 4 MG DISINTEGRATING TABLET    Take 1 tablet (4 mg total) by mouth every 8 (eight) hours as needed for nausea or vomiting.     Shon Batonourtney F Aveena Bari, MD 09/19/16 [phone]

## 2016-09-18 NOTE — ED Triage Notes (Signed)
Pt presents to ED after developing generalized body aches and congestion x  Days ago.  Pt sts in past three days he has been having vomiting as well.  Pt sts still able to keep food down.  Denies diarrhea.

## 2016-09-19 MED ORDER — ONDANSETRON HCL 4 MG/2ML IJ SOLN
4.0000 mg | Freq: Once | INTRAMUSCULAR | Status: AC
  Administered 2016-09-19: 4 mg via INTRAVENOUS
  Filled 2016-09-19: qty 2

## 2016-09-19 MED ORDER — SODIUM CHLORIDE 0.9 % IV BOLUS (SEPSIS)
1000.0000 mL | Freq: Once | INTRAVENOUS | Status: AC
  Administered 2016-09-19: 1000 mL via INTRAVENOUS

## 2016-09-19 MED ORDER — ONDANSETRON 4 MG PO TBDP: 4.0000 mg | ORAL_TABLET | Freq: Three times a day (TID) | ORAL | 0 refills | Status: DC | PRN

## 2016-09-19 MED ORDER — LOPERAMIDE HCL 2 MG PO CAPS: 2.0000 mg | ORAL_CAPSULE | Freq: Four times a day (QID) | ORAL | 0 refills | Status: DC | PRN

## 2016-09-27 ENCOUNTER — Emergency Department (HOSPITAL_COMMUNITY)
Admission: EM | Admit: 2016-09-27 | Discharge: 2016-09-27 | Disposition: A | Discharge status: Home / Self Care | Payer: Self-pay | Attending: Emergency Medicine | Admitting: Emergency Medicine

## 2016-09-27 ENCOUNTER — Encounter (HOSPITAL_COMMUNITY): Payer: Self-pay | Admitting: Emergency Medicine

## 2016-09-27 DIAGNOSIS — I10 Essential (primary) hypertension: Secondary | ICD-10-CM | POA: Insufficient documentation

## 2016-09-27 DIAGNOSIS — F17210 Nicotine dependence, cigarettes, uncomplicated: Secondary | ICD-10-CM | POA: Insufficient documentation

## 2016-09-27 DIAGNOSIS — J45909 Unspecified asthma, uncomplicated: Secondary | ICD-10-CM | POA: Insufficient documentation

## 2016-09-27 DIAGNOSIS — Z79899 Other long term (current) drug therapy: Secondary | ICD-10-CM | POA: Insufficient documentation

## 2016-09-27 DIAGNOSIS — R369 Urethral discharge, unspecified: Secondary | ICD-10-CM | POA: Insufficient documentation

## 2016-09-27 LAB — COMPREHENSIVE METABOLIC PANEL
ALK PHOS: 66 U/L (ref 38–126)
ALT: 35 U/L (ref 17–63)
ANION GAP: 8 (ref 5–15)
AST: 23 U/L (ref 15–41)
Albumin: 4.5 g/dL (ref 3.5–5.0)
BILIRUBIN TOTAL: 0.8 mg/dL (ref 0.3–1.2)
BUN: 12 mg/dL (ref 6–20)
CALCIUM: 9.5 mg/dL (ref 8.9–10.3)
CO2: 25 mmol/L (ref 22–32)
CREATININE: 0.71 mg/dL (ref 0.61–1.24)
Chloride: 105 mmol/L (ref 101–111)
GFR calc non Af Amer: >60 mL/min (ref >60)
Glucose, Bld: 129 mg/dL — ABNORMAL HIGH (ref 65–99)
Potassium: 4.2 mmol/L (ref 3.5–5.1)
Sodium: 138 mmol/L (ref 135–145)
TOTAL PROTEIN: 7.5 g/dL (ref 6.5–8.1)

## 2016-09-27 LAB — URINALYSIS, ROUTINE W REFLEX MICROSCOPIC
Bilirubin Urine: NEGATIVE
GLUCOSE, UA: NEGATIVE mg/dL
HGB URINE DIPSTICK: NEGATIVE
Ketones, ur: NEGATIVE mg/dL
NITRITE: NEGATIVE
PH: 7 (ref 5.0–8.0)
Protein, ur: NEGATIVE mg/dL
Specific Gravity, Urine: 1.017 (ref 1.005–1.030)
Squamous Epithelial / LPF: NONE SEEN

## 2016-09-27 LAB — CBC
HCT: 45.1 % (ref 39.0–52.0)
HEMOGLOBIN: 15.7 g/dL (ref 13.0–17.0)
MCH: 30.6 pg (ref 26.0–34.0)
MCHC: 34.8 g/dL (ref 30.0–36.0)
MCV: 87.9 fL (ref 78.0–100.0)
PLATELETS: 238 10*3/uL (ref 150–400)
RBC: 5.13 MIL/uL (ref 4.22–5.81)
RDW: 13.1 % (ref 11.5–15.5)
WBC: 7.6 10*3/uL (ref 4.0–10.5)

## 2016-09-27 MED ORDER — METRONIDAZOLE 500 MG PO TABS
2000.0000 mg | ORAL_TABLET | Freq: Once | ORAL | Status: AC
  Administered 2016-09-27: 2000 mg via ORAL
  Filled 2016-09-27: qty 4

## 2016-09-27 MED ORDER — STERILE WATER FOR INJECTION IJ SOLN
INTRAMUSCULAR | Status: AC
Start: 2016-09-27 — End: 2016-09-27
  Administered 2016-09-27: 10 mL
  Filled 2016-09-27: qty 10

## 2016-09-27 MED ORDER — AZITHROMYCIN 250 MG PO TABS
1000.0000 mg | ORAL_TABLET | Freq: Once | ORAL | Status: AC
  Administered 2016-09-27: 1000 mg via ORAL
  Filled 2016-09-27: qty 4

## 2016-09-27 MED ORDER — CEFTRIAXONE SODIUM 250 MG IJ SOLR
250.0000 mg | Freq: Once | INTRAMUSCULAR | Status: AC
  Administered 2016-09-27: 250 mg via INTRAMUSCULAR
  Filled 2016-09-27: qty 250

## 2016-09-27 NOTE — ED Provider Notes (Signed)
[facility]-EM[facility]Y DEPT Provider Note   CSN: [id number] Arrival date & time: 09/27/16  0907     History   Chief Complaint Chief Complaint  Patient presents with  . Penile Discharge    HPI Spencer Davis is a 25 y.o. male.  HPI   Spencer Davis is a 25 y.o. male, with a history of HTN and previous STD, presenting to the ED with penile discharge and dysuria beginning about a week ago. Discharge is thick and yellow. Intermittent lower abdominal pain and nausea, none currently. Denies fever/chills, vomiting, diarrhea, testicular pain or swelling, pain with BMs, or any other complaints.   Endorses unprotected sexual contact with multiple partners. Patient endorses a previous STD history, but does not remember his diagnosis.    Past Medical History:  Diagnosis Date  . Asthma   . Hypertension     There are no active problems to display for this patient.   History reviewed. No pertinent surgical history.     Home Medications    Prior to Admission medications   Medication Sig Start Date End Date Taking? Authorizing Provider  albuterol (PROVENTIL HFA;VENTOLIN HFA) 108 (90 Base) MCG/ACT inhaler Inhale 1 puff into the lungs every 6 (six) hours as needed for wheezing or shortness of breath.   Yes Historical Provider, MD  hydrochlorothiazide (HYDRODIURIL) 25 MG tablet Take 25 mg by mouth daily.   Yes Historical Provider, MD  lisinopril (PRINIVIL,ZESTRIL) 10 MG tablet Take 10 mg by mouth daily.   Yes Historical Provider, MD  loperamide (IMODIUM) 2 MG capsule Take 1 capsule (2 mg total) by mouth 4 (four) times daily as needed for diarrhea or loose stools. 09/19/16  Yes Shon Batonourtney F Horton, MD  ondansetron (ZOFRAN ODT) 4 MG disintegrating tablet Take 1 tablet (4 mg total) by mouth every 8 (eight) hours as needed for nausea or vomiting. 09/19/16  Yes Shon Batonourtney F Horton, MD  metroNIDAZOLE (FLAGYL) 500 MG tablet Take 1 tablet (500 mg total) by mouth 2 (two) times daily. One tab PO bid x 10  days Patient not taking: Reported on 09/19/2016 07/06/13   Joni ReiningNicole Pisciotta, PA-C  oxyCODONE-acetaminophen (PERCOCET/ROXICET) 5-325 MG per tablet 1 to 2 tabs PO q6hrs  PRN for pain Patient not taking: Reported on 09/19/2016 07/06/13   Joni ReiningNicole Pisciotta, PA-C  traMADol (ULTRAM) 50 MG tablet Take 1 tablet (50 mg total) by mouth every 6 (six) hours as needed for pain. Patient not taking: Reported on 09/19/2016 07/04/13   Earley FavorGail Schulz, NP    Family History History reviewed. No pertinent family history.  Social History Social History  Substance Use Topics  . Smoking status: Current Every Day Smoker    Packs/day: 0.25    Types: Cigarettes  . Smokeless tobacco: Never Used  . Alcohol use Yes     Allergies   Penicillins   Review of Systems Review of Systems  Gastrointestinal: Positive for abdominal pain (intermittent) and nausea (intermittent). Negative for vomiting.  Genitourinary: Positive for discharge and dysuria. Negative for scrotal swelling and testicular pain.  All other systems reviewed and are negative.    Physical Exam Updated Vital Signs BP 128/81 (BP Location: Left Arm)   Pulse 80   Temp 98 F (36.7 C) (Oral)   Resp 18   Ht 5\' 7"  (1.702 m)   Wt 110.2 kg   SpO2 98%   BMI 38.06 kg/m   Physical Exam  Constitutional: He appears well-developed and well-nourished. No distress.  HENT:  Head: Normocephalic and atraumatic.  Eyes: Conjunctivae are  normal.  Neck: Neck supple.  Cardiovascular: Normal rate, regular rhythm, normal heart sounds and intact distal pulses.   Pulmonary/Chest: Effort normal and breath sounds normal. No respiratory distress.  Abdominal: Soft. There is no tenderness. There is no guarding.  Genitourinary:  Genitourinary Comments: Penis, scrotum, and testicles without swelling, lesions, or tenderness. Scant, yellow penile discharge.  No inguinal hernia noted. Cremasteric reflex intact. Otherwise normal male genitalia. RN/Med [employer], Courtney, served as  Biomedical engineerchaperone during the exam.  Musculoskeletal: He exhibits no edema.  Lymphadenopathy:    He has no cervical adenopathy.  Neurological: He is alert.  Skin: Skin is warm and dry. He is not diaphoretic.  Psychiatric: He has a normal mood and affect. His behavior is normal.  Nursing note and vitals reviewed.    ED Treatments / Results  Labs (all labs ordered are listed, but only abnormal results are displayed) Labs Reviewed  COMPREHENSIVE METABOLIC PANEL - Abnormal; Notable for the following:       Result Value   Glucose, Bld 129 (*)    All other components within normal limits  URINALYSIS, ROUTINE W REFLEX MICROSCOPIC - Abnormal; Notable for the following:    APPearance HAZY (*)    Leukocytes, UA TRACE (*)    Bacteria, UA RARE (*)    All other components within normal limits  GC/CHLAMYDIA PROBE AMP ([place]) NOT AT [facility] - Abnormal; Notable for the following:    Chlamydia **POSITIVE** (*)    All other components within normal limits  CBC  HIV ANTIBODY (ROUTINE TESTING)  RPR    EKG  EKG Interpretation None       Radiology No results found.  Procedures Procedures (including critical care time)  Medications Ordered in ED Medications  cefTRIAXone (ROCEPHIN) injection 250 mg (250 mg Intramuscular Given 09/27/16 1107)  azithromycin (ZITHROMAX) tablet 1,000 mg (1,000 mg Oral Given 09/27/16 1103)  metroNIDAZOLE (FLAGYL) tablet 2,000 mg (2,000 mg Oral Given 09/27/16 1103)  sterile water (preservative free) injection (10 mLs  Given 09/27/16 1107)     Initial Impression / Assessment and Plan / ED Course  I have reviewed the triage vital signs and the nursing notes.  Pertinent labs & imaging results that were available during my care of the patient were reviewed by me and considered in my medical decision making (see chart for details).  Clinical Course     Patient presents with complaint of penile discharge. Symptoms suspicious for a STD. Patient tested and treated.  Counseled on safe sex practices.      Final Clinical Impressions(s) / ED Diagnoses   Final diagnoses:  Penile discharge    New Prescriptions Discharge Medication List as of 09/27/2016 10:54 AM       Anselm PancoastShawn C Joy, PA-C 09/28/16 1657    Alvira MondayErin Schlossman, MD 09/30/16 1248

## 2016-09-27 NOTE — Discharge Instructions (Signed)
You have been tested for STDs. Some of these results are still pending. Any abnormalities will be called to you. You have been prophylactically treated for gonorrhea, Chlamydia, and Trichomonas. This does not mean you necessarily have these diseases, treatment is precautionary. Be sure to follow safe sex practices, including monogamy and/or condom use. °

## 2016-09-27 NOTE — ED Triage Notes (Signed)
Pt c/o urethral burning at end of urination with cramping at base of penis, yellow/white penile discharge, right lower abdominal cramping, nausea.

## 2016-09-28 LAB — GC/CHLAMYDIA PROBE AMP (~~LOC~~) NOT AT ARMC
CHLAMYDIA, DNA PROBE: POSITIVE — AB
Neisseria Gonorrhea: NEGATIVE

## 2016-09-28 LAB — RPR: RPR Ser Ql: NONREACTIVE

## 2016-09-28 LAB — HIV ANTIBODY (ROUTINE TESTING W REFLEX): HIV Screen 4th Generation wRfx: NONREACTIVE

## 2017-01-19 ENCOUNTER — Ambulatory Visit (HOSPITAL_COMMUNITY)
Admission: EM | Admit: 2017-01-19 | Discharge: 2017-01-19 | Disposition: A | Discharge status: Home / Self Care | Payer: Self-pay | Attending: Internal Medicine | Admitting: Internal Medicine

## 2017-01-19 ENCOUNTER — Encounter (HOSPITAL_COMMUNITY): Payer: Self-pay | Admitting: Emergency Medicine

## 2017-01-19 DIAGNOSIS — J45909 Unspecified asthma, uncomplicated: Secondary | ICD-10-CM | POA: Insufficient documentation

## 2017-01-19 DIAGNOSIS — I10 Essential (primary) hypertension: Secondary | ICD-10-CM | POA: Insufficient documentation

## 2017-01-19 DIAGNOSIS — Z880 Allergy status to penicillin: Secondary | ICD-10-CM | POA: Insufficient documentation

## 2017-01-19 DIAGNOSIS — F17210 Nicotine dependence, cigarettes, uncomplicated: Secondary | ICD-10-CM | POA: Insufficient documentation

## 2017-01-19 DIAGNOSIS — L02412 Cutaneous abscess of left axilla: Secondary | ICD-10-CM | POA: Insufficient documentation

## 2017-01-19 DIAGNOSIS — Z79899 Other long term (current) drug therapy: Secondary | ICD-10-CM | POA: Insufficient documentation

## 2017-01-19 MED ORDER — HYDROMORPHONE HCL 1 MG/ML IJ SOLN
1.0000 mg | Freq: Once | INTRAMUSCULAR | Status: AC
  Administered 2017-01-19: 1 mg via INTRAMUSCULAR

## 2017-01-19 MED ORDER — ONDANSETRON 4 MG PO TBDP
4.0000 mg | ORAL_TABLET | Freq: Once | ORAL | Status: AC
  Administered 2017-01-19: 4 mg via ORAL

## 2017-01-19 MED ORDER — CEFTRIAXONE SODIUM 1 G IJ SOLR
INTRAMUSCULAR | Status: AC
  Filled 2017-01-19: qty 10

## 2017-01-19 MED ORDER — HYDROMORPHONE HCL 1 MG/ML IJ SOLN
INTRAMUSCULAR | Status: AC
  Filled 2017-01-19: qty 1

## 2017-01-19 MED ORDER — SULFAMETHOXAZOLE-TRIMETHOPRIM 800-160 MG PO TABS: 1.0000 | ORAL_TABLET | Freq: Two times a day (BID) | ORAL | 0 refills | Status: AC

## 2017-01-19 MED ORDER — HYDROCODONE-ACETAMINOPHEN 5-325 MG PO TABS: 1.0000 | ORAL_TABLET | Freq: Four times a day (QID) | ORAL | 0 refills | Status: DC | PRN

## 2017-01-19 MED ORDER — LIDOCAINE HCL 2 % IJ SOLN
INTRAMUSCULAR | Status: AC
  Filled 2017-01-19: qty 20

## 2017-01-19 MED ORDER — ONDANSETRON 4 MG PO TBDP
ORAL_TABLET | ORAL | Status: AC
  Filled 2017-01-19: qty 1

## 2017-01-19 MED ORDER — CEFTRIAXONE SODIUM 1 G IJ SOLR
1.0000 g | Freq: Once | INTRAMUSCULAR | Status: AC
  Administered 2017-01-19: 1 g via INTRAMUSCULAR

## 2017-01-19 NOTE — Discharge Instructions (Signed)
Your abscess has been drained in clinic today. You have been given an injection of Rocephin and a prescription for Bactrim. Take the bactrim twice a day for 7 days. Wound cultures have been taken as well, if they grow out anything that is not covered by the antibiotics I have given you, you will be notified. I have also prescribed a medicine for pain called hydrocodone, this medicine is a narcotic, it will cause drowsiness, and it is addictive. Do not take more than what is necessary, do not drink alcohol while taking, and do not operate any heavy machinery while taking this medicine. If you see any signs of infection, go to the Er or return to clinic as soon as possible.

## 2017-01-19 NOTE — ED Triage Notes (Signed)
PT reports abscess under left arm that has been present for 3 days.

## 2017-01-19 NOTE — ED Provider Notes (Signed)
CSN: [id number]     Arrival date & time 01/19/17  1201 History   First MD Initiated Contact with Patient 01/19/17 1235     Chief Complaint  Patient presents with  . Abscess   (Consider location/radiation/quality/duration/timing/severity/associated sxs/prior Treatment) 26 year old male presents to clinic for evaluation of an abscess to the left axillary area been worsening over the previous 3 days   The history is provided by the patient.  Abscess  Location:  Torso Torso abscess location:  L axilla Size:  3 cm Abscess quality: induration, painful, redness and warmth   Red streaking: no   Duration:  3 days Progression:  Worsening Pain details:    Quality:  Pressure, sharp, throbbing and tightness   Severity:  Severe   Duration:  3 days   Progression:  Worsening Chronicity:  New Context: not diabetes, not immunosuppression, not injected drug use and not skin injury   Relieved by:  Nothing Exacerbated by: touching and pressure. Ineffective treatments:  NSAIDs Associated symptoms: no anorexia, no fatigue, no fever, no headaches, no nausea and no vomiting   Risk factors: no hx of MRSA and no prior abscess     Past Medical History:  Diagnosis Date  . Asthma   . Hypertension    History reviewed. No pertinent surgical history. No family history on file. Social History  Substance Use Topics  . Smoking status: Current Every Day Smoker    Packs/day: 0.50    Types: Cigarettes  . Smokeless tobacco: Never Used  . Alcohol use No    Review of Systems  Constitutional: Negative for appetite change, chills, fatigue and fever.  Respiratory: Negative for shortness of breath and wheezing.   Cardiovascular: Negative for chest pain and palpitations.  Gastrointestinal: Negative for abdominal pain, anorexia, nausea and vomiting.  Musculoskeletal: Negative for back pain, neck pain and neck stiffness.  Skin: Positive for color change and wound.  Allergic/Immunologic: Negative for  immunocompromised state.  Neurological: Negative for dizziness, light-headedness and headaches.  Hematological: Negative for adenopathy.  All other systems reviewed and are negative.   Allergies  Penicillins  Home Medications   Prior to Admission medications   Medication Sig Start Date End Date Taking? Authorizing Provider  albuterol (PROVENTIL HFA;VENTOLIN HFA) 108 (90 Base) MCG/ACT inhaler Inhale 1 puff into the lungs every 6 (six) hours as needed for wheezing or shortness of breath.    Historical Provider, MD  hydrochlorothiazide (HYDRODIURIL) 25 MG tablet Take 25 mg by mouth daily.    Historical Provider, MD  HYDROcodone-acetaminophen (NORCO/VICODIN) 5-325 MG tablet Take 1 tablet by mouth every 6 (six) hours as needed. 01/19/17   Dorena Bodo, NP  lisinopril (PRINIVIL,ZESTRIL) 10 MG tablet Take 10 mg by mouth daily.    Historical Provider, MD  loperamide (IMODIUM) 2 MG capsule Take 1 capsule (2 mg total) by mouth 4 (four) times daily as needed for diarrhea or loose stools. 09/19/16   Shon Baton, MD  ondansetron (ZOFRAN ODT) 4 MG disintegrating tablet Take 1 tablet (4 mg total) by mouth every 8 (eight) hours as needed for nausea or vomiting. 09/19/16   Shon Baton, MD  oxyCODONE-acetaminophen (PERCOCET/ROXICET) 5-325 MG per tablet 1 to 2 tabs PO q6hrs  PRN for pain Patient not taking: Reported on 09/19/2016 07/06/13   Joni Reining Pisciotta, PA-C  sulfamethoxazole-trimethoprim (BACTRIM DS,SEPTRA DS) 800-160 MG tablet Take 1 tablet by mouth 2 (two) times daily. 01/19/17 01/26/17  Dorena Bodo, NP  traMADol (ULTRAM) 50 MG tablet Take 1 tablet (50 mg  total) by mouth every 6 (six) hours as needed for pain. Patient not taking: Reported on 09/19/2016 07/04/13   Earley Favor, NP   Meds Ordered and Administered this Visit   Medications  ondansetron (ZOFRAN-ODT) disintegrating tablet 4 mg (4 mg Oral Given 01/19/17 1313)  HYDROmorphone (DILAUDID) injection 1 mg (1 mg Intramuscular Given 01/19/17  1313)  cefTRIAXone (ROCEPHIN) injection 1 g (1 g Intramuscular Given 01/19/17 1343)    BP 137/79   Pulse 75   Temp 98.8 F (37.1 C) (Oral)   Resp 16   Ht  (1.702 m)   Wt 263 lb (119.3 kg)   SpO2 100%   BMI 41.19 kg/m  No data found.   Physical Exam  Constitutional: He is oriented to person, place, and time. He appears well-developed and well-nourished. No distress.  HENT:  Head: Normocephalic and atraumatic.  Right Ear: External ear normal.  Left Ear: External ear normal.  Cardiovascular: Normal rate and regular rhythm.   Pulmonary/Chest: Effort normal and breath sounds normal.  Abdominal: Soft. Bowel sounds are normal.  Neurological: He is alert and oriented to person, place, and time.  Skin: Skin is warm and dry. Capillary refill takes less than 2 seconds. He is not diaphoretic.  Psychiatric: He has a normal mood and affect. His behavior is normal.  Nursing note and vitals reviewed.   Urgent Care Course     .Marland KitchenIncision and Drainage Date/Time: 01/19/2017 2:01 PM Performed by: Dorena Bodo Authorized by: Eustace Moore   Consent:    Consent obtained:  Verbal   Consent given by:  Patient   Risks discussed:  Bleeding, infection, pain and incomplete drainage   Alternatives discussed:  No treatment and delayed treatment Location:    Type:  Abscess   Size:  3 cm   Location:  Trunk   Trunk location: left axilia  Pre-procedure details:    Skin preparation:  Betadine Anesthesia (see MAR for exact dosages):    Anesthesia method:  Local infiltration (Premidicated with Dilaudid and Zofran)   Local anesthetic:  Lidocaine 2% WITH epi Procedure type:    Complexity:  Simple Procedure details:    Needle aspiration: no     Incision types:  Stab incision   Scalpel blade:  11   Wound management:  Probed and deloculated, irrigated with saline and extensive cleaning   Drainage:  Purulent   Drainage amount:  Moderate   Wound treatment:  Wound left open   Packing  materials:  None Post-procedure details:    Patient tolerance of procedure:  Tolerated well, no immediate complications   (including critical care time)  Labs Review Labs Reviewed  AEROBIC/ANAEROBIC CULTURE (SURGICAL/DEEP WOUND)    Imaging Review No results found.      MDM   1. Abscess of left axilla    Abscess was numbed with lidocaine and epinephrine, incision and drainage was conducted in clinic, wound cultures were obtained and sent to laboratory for testing, patient was given injection of Rocephin in clinic, discharged home with prescription of Bactrim, and hydrocodone. Follow-up guidelines as well as wound care instructions were given to the patient.  [place] controlled substances reporting system consulted prior to issuing prescription, no inappropriate controlled substances were written in the previous 6 months.     Dorena Bodo, NP 01/19/17 [phone]

## 2017-01-24 ENCOUNTER — Encounter (HOSPITAL_COMMUNITY): Payer: Self-pay | Admitting: Emergency Medicine

## 2017-01-24 ENCOUNTER — Emergency Department (HOSPITAL_COMMUNITY)
Admission: EM | Admit: 2017-01-24 | Discharge: 2017-01-24 | Disposition: A | Discharge status: Home / Self Care | Payer: Self-pay | Attending: Emergency Medicine | Admitting: Emergency Medicine

## 2017-01-24 DIAGNOSIS — I10 Essential (primary) hypertension: Secondary | ICD-10-CM | POA: Insufficient documentation

## 2017-01-24 DIAGNOSIS — F17210 Nicotine dependence, cigarettes, uncomplicated: Secondary | ICD-10-CM | POA: Insufficient documentation

## 2017-01-24 DIAGNOSIS — K0889 Other specified disorders of teeth and supporting structures: Secondary | ICD-10-CM | POA: Insufficient documentation

## 2017-01-24 DIAGNOSIS — J45909 Unspecified asthma, uncomplicated: Secondary | ICD-10-CM | POA: Insufficient documentation

## 2017-01-24 DIAGNOSIS — Z79899 Other long term (current) drug therapy: Secondary | ICD-10-CM | POA: Insufficient documentation

## 2017-01-24 LAB — AEROBIC/ANAEROBIC CULTURE (SURGICAL/DEEP WOUND)

## 2017-01-24 LAB — AEROBIC/ANAEROBIC CULTURE W GRAM STAIN (SURGICAL/DEEP WOUND)

## 2017-01-24 MED ORDER — BUPIVACAINE HCL (PF) 0.25 % IJ SOLN
5.0000 mL | Freq: Once | INTRAMUSCULAR | Status: AC
  Administered 2017-01-24: 5 mL
  Filled 2017-01-24: qty 30

## 2017-01-24 NOTE — ED Notes (Signed)
Items for dental block given to EDP.

## 2017-01-24 NOTE — ED Provider Notes (Signed)
[facility]-EMERGENCY DEPT Provider Note   CSN: [id number] Arrival date & time: 01/24/17  6213     History   Chief Complaint Chief Complaint  Patient presents with  . Dental Pain    HPI Spencer Davis is a 26 y.o. male with a past medical history significant for hypertension, asthma, and dental caries status post partial filling on a left upper tooth and October presents with 2 days of dental pain. Patient says that he is unsure if his temporary filling fell out or just began hurting. He says that for the last 2 days, he has had a 5 out of 10 severity in his tooth. He says it radiates towards his head. He denies any fevers, chills, nausea, vomiting, trismus. He denies any neck pain or neck stiffness. He has full range of motion of his neck. He has no difficulty breathing or swallowing. He denies any recent dental trauma. He denies any other complaints. He has not taken medicine to help her symptoms and has not spoken with his dentistry team.    The history is provided by the patient.  Dental Pain   This is a new problem. The current episode started 2 days ago. The problem occurs constantly. The problem has not changed since onset.The pain is at a severity of 6/10. The pain is moderate. He has tried nothing for the symptoms. The treatment provided no relief.    Past Medical History:  Diagnosis Date  . Asthma   . Hypertension     There are no active problems to display for this patient.   History reviewed. No pertinent surgical history.     Home Medications    Prior to Admission medications   Medication Sig Start Date End Date Taking? Authorizing Provider  albuterol (PROVENTIL HFA;VENTOLIN HFA) 108 (90 Base) MCG/ACT inhaler Inhale 1 puff into the lungs every 6 (six) hours as needed for wheezing or shortness of breath.    Historical Provider, MD  hydrochlorothiazide (HYDRODIURIL) 25 MG tablet Take 25 mg by mouth daily.    Historical Provider, MD  HYDROcodone-acetaminophen  (NORCO/VICODIN) 5-325 MG tablet Take 1 tablet by mouth every 6 (six) hours as needed. 01/19/17   Dorena Bodo, NP  lisinopril (PRINIVIL,ZESTRIL) 10 MG tablet Take 10 mg by mouth daily.    Historical Provider, MD  loperamide (IMODIUM) 2 MG capsule Take 1 capsule (2 mg total) by mouth 4 (four) times daily as needed for diarrhea or loose stools. 09/19/16   Shon Baton, MD  ondansetron (ZOFRAN ODT) 4 MG disintegrating tablet Take 1 tablet (4 mg total) by mouth every 8 (eight) hours as needed for nausea or vomiting. 09/19/16   Shon Baton, MD  oxyCODONE-acetaminophen (PERCOCET/ROXICET) 5-325 MG per tablet 1 to 2 tabs PO q6hrs  PRN for pain Patient not taking: Reported on 09/19/2016 07/06/13   Joni Reining Pisciotta, PA-C  sulfamethoxazole-trimethoprim (BACTRIM DS,SEPTRA DS) 800-160 MG tablet Take 1 tablet by mouth 2 (two) times daily. 01/19/17 01/26/17  Dorena Bodo, NP  traMADol (ULTRAM) 50 MG tablet Take 1 tablet (50 mg total) by mouth every 6 (six) hours as needed for pain. Patient not taking: Reported on 09/19/2016 07/04/13   Earley Favor, NP    Family History History reviewed. No pertinent family history.  Social History Social History  Substance Use Topics  . Smoking status: Current Every Day Smoker    Packs/day: 0.50    Types: Cigarettes  . Smokeless tobacco: Never Used  . Alcohol use No     Allergies  Penicillins   Review of Systems Review of Systems  Constitutional: Negative for activity change, appetite change, chills, diaphoresis, fatigue and fever.  HENT: Positive for dental problem. Negative for congestion and rhinorrhea.   Eyes: Negative for visual disturbance.  Respiratory: Negative for cough, chest tightness, shortness of breath, wheezing and stridor.   Cardiovascular: Negative for chest pain, palpitations and leg swelling.  Gastrointestinal: Negative for abdominal distention, abdominal pain, blood in stool, constipation, diarrhea, nausea and vomiting.    Genitourinary: Negative for difficulty urinating, dysuria and flank pain.  Musculoskeletal: Negative for back pain and gait problem.  Skin: Negative for rash and wound.  Neurological: Negative for dizziness, weakness, light-headedness, numbness and headaches.  Psychiatric/Behavioral: Negative for agitation.  All other systems reviewed and are negative.    Physical Exam Updated Vital Signs BP 102/66 (BP Location: Left Arm)   Pulse 81   Temp 98 F (36.7 C) (Oral)   Resp 16   SpO2 99%   Physical Exam  Constitutional: He is oriented to person, place, and time. He appears well-developed and well-nourished. No distress.  HENT:  Head: Normocephalic and atraumatic.  Right Ear: External ear normal.  Left Ear: External ear normal.  Nose: Nose normal.  Mouth/Throat: Uvula is midline and oropharynx is clear and moist. He does not have dentures. No oral lesions. No trismus in the jaw. Abnormal dentition. Dental caries present. No dental abscesses or uvula swelling. No oropharyngeal exudate.    Eyes: Conjunctivae and EOM are normal. Pupils are equal, round, and reactive to light.  Neck: Normal range of motion. Neck supple.  Cardiovascular: Normal rate, normal heart sounds and intact distal pulses.   No murmur heard. Pulmonary/Chest: Effort normal and breath sounds normal. No stridor. No respiratory distress. He has no wheezes. He exhibits no tenderness.  Abdominal: Soft. There is no tenderness. There is no rebound and no guarding.  Musculoskeletal: He exhibits no edema or tenderness.  Neurological: He is alert and oriented to person, place, and time. No sensory deficit. He exhibits normal muscle tone.  Skin: Skin is warm. Capillary refill takes less than 2 seconds. No rash noted. He is not diaphoretic. No erythema. No pallor.  Psychiatric: He has a normal mood and affect.     ED Treatments / Results  Labs (all labs ordered are listed, but only abnormal results are displayed) Labs  Reviewed - No data to display  EKG  EKG Interpretation None       Radiology No results found.  Procedures Dental Block Date/Time: 01/24/2017 9:38 AM Performed by: Heide Scales Authorized by: Heide Scales   Consent:    Consent obtained:  Verbal   Consent given by:  Patient   Risks discussed:  Infection, allergic reaction, nerve damage, pain, swelling and unsuccessful block   Alternatives discussed:  No treatment Indications:    Indications: dental pain   Location:    Block type:  Supraperiosteal   Supraperiosteal location:  Upper teeth   Upper teeth location:  11/LU cuspid Procedure details (see MAR for exact dosages):    Syringe type:  Controlled syringe   Needle gauge:  25 G   Anesthetic injected:  Bupivacaine 0.25% w/o epi   Injection procedure:  Anatomic landmarks identified, negative aspiration for blood, introduced needle, incremental injection and anatomic landmarks palpated Post-procedure details:    Outcome:  Pain relieved   Patient tolerance of procedure:  Tolerated well, no immediate complications   (including critical care time)  Medications Ordered in ED Medications -  No data to display   Initial Impression / Assessment and Plan / ED Course  I have reviewed the triage vital signs and the nursing notes.  Pertinent labs & imaging results that were available during my care of the patient were reviewed by me and considered in my medical decision making (see chart for details).     Shariff Lasky is a 26 y.o. male with a past medical history significant for hypertension, asthma, and dental caries status post partial filling on a left upper tooth and October presents with 2 days of dental pain.  History and exam are seen above. On exam, patient has tenderness of a left upper tooth still has a temporary filling in place. The tooth is tender but is not loose. There is no other tooth tenderness. No dental drainage. No erythema or swelling of  the gums. No tenderness to the mandible or maxilla. No sinus tenderness. No numbness tingling or weakness of the face. Normal eye exam. No trismus or other abnormality on exam.  Given patient's pain, patient was offered a dental Marcaine injection to try and alleviate discomfort.  Patient wants to undergo injections. This will be performed. Patient will be instructed to follow-up with his dentistry team for further and more definitive management.  Dental block was performed at described above. Patient had some improvement in pain. Patient instructed to follow-up with his dentist for further management. Patient understood return precautions for signs and symptoms of infection. Patient had no other questions, concerns, or complaints and patient was discharged in good condition.    Final Clinical Impressions(s) / ED Diagnoses   Final diagnoses:  Pain, dental    New Prescriptions New Prescriptions   No medications on file    Clinical Impression: 1. Pain, dental     Disposition: Discharge  Condition: Good  I have discussed the results, Dx and Tx plan with the pt(& family if present). He/she/they expressed understanding and agree(s) with the plan. Discharge instructions discussed at great length. Strict return precautions discussed and pt &/or family have verbalized understanding of the instructions. No further questions at time of discharge.    New Prescriptions   No medications on file    Follow Up: [facility] DEPT [address] [id number] [place] [address] [phone]  If symptoms worsen     Heide Scales, MD 01/24/17 [id number]

## 2017-01-24 NOTE — ED Triage Notes (Signed)
Pt c/o pain at site of avulsed left upper molar dental filling onset 2 days ago, pain radiates up to left head. Worse at night.

## 2017-01-24 NOTE — ED Notes (Signed)
Pt verbalized understanding of discharge instructions and denies any further questions at this time.   

## 2017-01-24 NOTE — Discharge Instructions (Signed)
Please follow-up with your dentist for further and definitive management of your dental pain. If any symptoms change or worsen or you begin developing symptoms of infection, please return to the nearest emergency department.

## 2017-06-20 ENCOUNTER — Ambulatory Visit (HOSPITAL_COMMUNITY)
Admission: EM | Admit: 2017-06-20 | Discharge: 2017-06-20 | Disposition: A | Discharge status: Home / Self Care | Payer: BLUE CROSS/BLUE SHIELD | Attending: Internal Medicine | Admitting: Internal Medicine

## 2017-06-20 ENCOUNTER — Encounter (HOSPITAL_COMMUNITY): Payer: Self-pay | Admitting: Emergency Medicine

## 2017-06-20 DIAGNOSIS — L02411 Cutaneous abscess of right axilla: Secondary | ICD-10-CM | POA: Diagnosis not present

## 2017-06-20 DIAGNOSIS — L0291 Cutaneous abscess, unspecified: Secondary | ICD-10-CM

## 2017-06-20 DIAGNOSIS — M5489 Other dorsalgia: Secondary | ICD-10-CM | POA: Diagnosis not present

## 2017-06-20 DIAGNOSIS — S161XXA Strain of muscle, fascia and tendon at neck level, initial encounter: Secondary | ICD-10-CM

## 2017-06-20 DIAGNOSIS — M25512 Pain in left shoulder: Secondary | ICD-10-CM | POA: Diagnosis not present

## 2017-06-20 MED ORDER — CEFTRIAXONE SODIUM 1 G IJ SOLR
INTRAMUSCULAR | Status: AC
  Filled 2017-06-20: qty 10

## 2017-06-20 MED ORDER — METHOCARBAMOL 500 MG PO TABS: 500.0000 mg | ORAL_TABLET | Freq: Four times a day (QID) | ORAL | 0 refills | Status: DC

## 2017-06-20 MED ORDER — DICLOFENAC SODIUM 75 MG PO TBEC: 75.0000 mg | DELAYED_RELEASE_TABLET | Freq: Two times a day (BID) | ORAL | 0 refills | Status: DC

## 2017-06-20 MED ORDER — KETOROLAC TROMETHAMINE 30 MG/ML IJ SOLN
30.0000 mg | Freq: Once | INTRAMUSCULAR | Status: AC
  Administered 2017-06-20: 30 mg via INTRAMUSCULAR

## 2017-06-20 MED ORDER — KETOROLAC TROMETHAMINE 30 MG/ML IJ SOLN
INTRAMUSCULAR | Status: AC
  Filled 2017-06-20: qty 1

## 2017-06-20 MED ORDER — BUPIVACAINE HCL (PF) 0.5 % IJ SOLN
INTRAMUSCULAR | Status: AC
  Filled 2017-06-20: qty 10

## 2017-06-20 MED ORDER — CEFTRIAXONE SODIUM 1 G IJ SOLR
1.0000 g | Freq: Once | INTRAMUSCULAR | Status: AC
  Administered 2017-06-20: 1 g via INTRAMUSCULAR

## 2017-06-20 MED ORDER — TRIAMCINOLONE ACETONIDE 40 MG/ML IJ SUSP
INTRAMUSCULAR | Status: AC
  Filled 2017-06-20: qty 1

## 2017-06-20 MED ORDER — DOXYCYCLINE HYCLATE 100 MG PO CAPS
100.0000 mg | ORAL_CAPSULE | Freq: Two times a day (BID) | ORAL | 0 refills | Status: DC
Start: 2017-06-20 — End: 2017-11-23

## 2017-06-20 NOTE — ED Provider Notes (Signed)
[facility]   [id number] 06/20/17 Arrival Time: 1038   SUBJECTIVE:  Spencer Davis is a 26 y.o. male who presents to the urgent care with complaint of abscess right axillary been present for 2 days that is open and draining. He has had recurrent abscesses, and was most recently seen here in April for an abscess on the left axillary. Cultures and sensitivities showed staph aureus. There is no known risk factors for MRSA. He has no fever, chills, malaise, nausea, vomiting, or other systemic symptoms.  He also has a secondary complaint of back pain and shoulder pain along with neck pain left side is been present for more than a month, is tried Tylenol and Motrin with minimal relief. There is no repetitive motion history, no history of trauma. No night sweats, fevers, or unexpected weight loss     Past Medical History:  Diagnosis Date  . Asthma   . Hypertension    No family history on file. Social History   Social History  . Marital status: Single    Spouse name: N/A  . Number of children: N/A  . Years of education: N/A   Occupational History  . Not on file.   Social History Main Topics  . Smoking status: Current Every Day Smoker    Packs/day: 0.50    Types: Cigarettes  . Smokeless tobacco: Never Used  . Alcohol use No  . Drug use: No  . Sexual activity: Yes    Birth control/ protection: None   Other Topics Concern  . Not on file   Social History Narrative  . No narrative on file   No outpatient prescriptions have been marked as taking for the 06/20/17 encounter [facility](Hospital Encounter).   Allergies  Allergen Reactions  . Penicillins Rash    Has patient had a PCN reaction causing immediate rash, facial/tongue/throat swelling, SOB or lightheadedness with hypotension: No Has patient had a PCN reaction causing severe rash involving mucus membranes or skin necrosis: No Has patient had a PCN reaction that required hospitalization No Has patient had a PCN reaction  occurring within the last 10 years: Yes If all of the above answers are "NO", then may proceed with Cephalosporin use.      ROS: As per HPI, remainder of ROS negative.   OBJECTIVE:  Vitals:   06/20/17 1127  BP: 114/82  Pulse: 90  Resp: (!) 22  Temp: 97.8 F (36.6 C)  TempSrc: Oral  SpO2: 97%    General appearance: alert; no distress Eyes: PERRL; EOMI; conjunctiva normal HENT: normocephalic; atraumatic;  external ears normal without trauma; Neck: supple Lungs: clear to auscultation bilaterally Heart: regular rate and rhythm Abdomen: soft, non-tender; bowel sounds normal; no masses or organomegaly; no guarding or rebound tenderness Back: no CVA tenderness, palpable muscle spasm left trapezius muscle Extremities: no cyanosis or edema; symmetrical with no gross deformities Skin: warm and dry, small 1 cm open and draining abscess right axillary with minimal erythema, no noted induration. Neurologic: normal gait; grossly normal Psychological: alert and cooperative; normal mood and affect      Labs:  Results for orders placed or performed during the hospital encounter of 01/19/17  Aerobic/Anaerobic Culture (surgical/deep wound)  Result Value Ref Range   Specimen Description ABSCESS    Special Requests NONE    Gram Stain      ABUNDANT WBC PRESENT, PREDOMINANTLY PMN MODERATE GRAM POSITIVE COCCI IN CLUSTERS    Culture      ABUNDANT STAPHYLOCOCCUS AUREUS NO ANAEROBES ISOLATED  Report Status 01/24/2017 FINAL    Organism ID, Bacteria STAPHYLOCOCCUS AUREUS       Susceptibility   Staphylococcus aureus - MIC*    CIPROFLOXACIN >=8 RESISTANT Resistant     ERYTHROMYCIN >=8 RESISTANT Resistant     GENTAMICIN <=0.5 SENSITIVE Sensitive     OXACILLIN 0.5 SENSITIVE Sensitive     TETRACYCLINE <=1 SENSITIVE Sensitive     VANCOMYCIN 1 SENSITIVE Sensitive     TRIMETH/SULFA <=10 SENSITIVE Sensitive     CLINDAMYCIN <=0.25 SENSITIVE Sensitive     RIFAMPIN <=0.5 SENSITIVE Sensitive      Inducible Clindamycin NEGATIVE Sensitive     * ABUNDANT STAPHYLOCOCCUS AUREUS    Labs Reviewed - No data to display  No results found.     ASSESSMENT & PLAN:  1. Strain of neck muscle, initial encounter   2. Abscess     Meds ordered this encounter  Medications  . cefTRIAXone (ROCEPHIN) injection 1 g  . ketorolac (TORADOL) 30 MG/ML injection 30 mg  . doxycycline (VIBRAMYCIN) 100 MG capsule    Sig: Take 1 capsule (100 mg total) by mouth 2 (two) times daily.    Dispense:  20 capsule    Refill:  0    Order Specific Question:   Supervising Provider    Answer:   Eustace Moore [[id number]]  . methocarbamol (ROBAXIN) 500 MG tablet    Sig: Take 1 tablet (500 mg total) by mouth 4 (four) times daily.    Dispense:  40 tablet    Refill:  0    Order Specific Question:   Supervising Provider    Answer:   Eustace Moore [[id number]]  . diclofenac (VOLTAREN) 75 MG EC tablet    Sig: Take 1 tablet (75 mg total) by mouth 2 (two) times daily.    Dispense:  20 tablet    Refill:  0    Order Specific Question:   Supervising Provider    Answer:   Eustace Moore [[id number]]   Abscess is small, open, and draining, was manually expressed with purulent material drained, we'll withhold I&D at this time, we'll treat with ceftriaxone and a short course of doxycycline. For musculoskeletal pain, we'll do trigger point injection, along with injection of Toradol as well.  Reviewed expectations re: course of current medical issues. Questions answered. Outlined signs and symptoms indicating need for more acute intervention. Patient verbalized understanding. After Visit Summary given.    Procedures:  Trigger Point Injection: Risks, and benefits were explained to the patient, and consent was obtained. Area of most tenderness was identified in the left trapezius muscle, an injection of 2-2-1 of lidocaine without epinephrine, bupivacaine without epinephrine, and Kenalog 40 mg was injected into the muscle  after thoroughly cleansing the area with Betadine. A bandage was applied, patient tolerated the procedure well without complaint      Dorena Bodo, NP 06/20/17 1303

## 2017-06-20 NOTE — Discharge Instructions (Signed)
You a muscle spasm in the trapezius muscle of your left back and shoulder area you have received what is called a "trigger point injection" for pain. I have prescribed two medicines for your pain. The first is diclofenac, take 1 tablet twice a day and the other is Robaxin, take 1 tablet up to 4 times a day. Robaxin may cause drowsiness so do not drive until you know how this medicine affects you. Also do not drink any alcohol either. You may apply ice and alternate with heat for 15 minutes at a time 4 times daily and for additional pain control you may take tylenol over the counter ever 4 hours but do not take more than 4000 mg a day. Should your pain continue or fail to resolve, follow up with your primary care provider or return to clinic as needed.   Since your abscess is small and draining, there is no need to open it today. We will give you antibiotics, take them as directed, apply warm compresses, and return to clinic as needed if symptoms worsen.

## 2017-06-20 NOTE — ED Triage Notes (Signed)
Noticed abscess 2 days ago.  Abscess located in right axilla.

## 2017-07-11 ENCOUNTER — Emergency Department (HOSPITAL_COMMUNITY): Payer: BLUE CROSS/BLUE SHIELD

## 2017-07-11 ENCOUNTER — Encounter (HOSPITAL_COMMUNITY): Payer: Self-pay | Admitting: Emergency Medicine

## 2017-07-11 ENCOUNTER — Emergency Department (HOSPITAL_COMMUNITY)
Admission: EM | Admit: 2017-07-11 | Discharge: 2017-07-11 | Disposition: A | Discharge status: Home / Self Care | Payer: BLUE CROSS/BLUE SHIELD | Attending: Emergency Medicine | Admitting: Emergency Medicine

## 2017-07-11 DIAGNOSIS — F17210 Nicotine dependence, cigarettes, uncomplicated: Secondary | ICD-10-CM | POA: Insufficient documentation

## 2017-07-11 DIAGNOSIS — M25512 Pain in left shoulder: Secondary | ICD-10-CM | POA: Insufficient documentation

## 2017-07-11 DIAGNOSIS — Y939 Activity, unspecified: Secondary | ICD-10-CM | POA: Insufficient documentation

## 2017-07-11 DIAGNOSIS — Y999 Unspecified external cause status: Secondary | ICD-10-CM | POA: Insufficient documentation

## 2017-07-11 DIAGNOSIS — R200 Anesthesia of skin: Secondary | ICD-10-CM | POA: Diagnosis not present

## 2017-07-11 DIAGNOSIS — M549 Dorsalgia, unspecified: Secondary | ICD-10-CM | POA: Diagnosis not present

## 2017-07-11 DIAGNOSIS — R102 Pelvic and perineal pain: Secondary | ICD-10-CM | POA: Insufficient documentation

## 2017-07-11 DIAGNOSIS — I10 Essential (primary) hypertension: Secondary | ICD-10-CM | POA: Diagnosis not present

## 2017-07-11 DIAGNOSIS — Z79899 Other long term (current) drug therapy: Secondary | ICD-10-CM | POA: Diagnosis not present

## 2017-07-11 DIAGNOSIS — R413 Other amnesia: Secondary | ICD-10-CM | POA: Insufficient documentation

## 2017-07-11 DIAGNOSIS — Y92410 Unspecified street and highway as the place of occurrence of the external cause: Secondary | ICD-10-CM | POA: Diagnosis not present

## 2017-07-11 DIAGNOSIS — M25511 Pain in right shoulder: Secondary | ICD-10-CM | POA: Diagnosis not present

## 2017-07-11 DIAGNOSIS — M542 Cervicalgia: Secondary | ICD-10-CM | POA: Diagnosis not present

## 2017-07-11 DIAGNOSIS — T1490XA Injury, unspecified, initial encounter: Secondary | ICD-10-CM

## 2017-07-11 LAB — I-STAT TROPONIN, ED: TROPONIN I, POC: 0.01 ng/mL (ref 0.00–0.08)

## 2017-07-11 LAB — SAMPLE TO BLOOD BANK

## 2017-07-11 LAB — URINALYSIS, ROUTINE W REFLEX MICROSCOPIC
BILIRUBIN URINE: NEGATIVE
Bacteria, UA: NONE SEEN
Glucose, UA: NEGATIVE mg/dL
Ketones, ur: NEGATIVE mg/dL
LEUKOCYTES UA: NEGATIVE
Nitrite: NEGATIVE
Protein, ur: NEGATIVE mg/dL
SPECIFIC GRAVITY, URINE: 1.033 — AB (ref 1.005–1.030)
pH: 5 (ref 5.0–8.0)

## 2017-07-11 LAB — COMPREHENSIVE METABOLIC PANEL
ALBUMIN: 4.2 g/dL (ref 3.5–5.0)
ALK PHOS: 80 U/L (ref 38–126)
ALT: 27 U/L (ref 17–63)
AST: 22 U/L (ref 15–41)
Anion gap: 7 (ref 5–15)
BUN: 13 mg/dL (ref 6–20)
CALCIUM: 9.6 mg/dL (ref 8.9–10.3)
CO2: 25 mmol/L (ref 22–32)
CREATININE: 1.01 mg/dL (ref 0.61–1.24)
Chloride: 106 mmol/L (ref 101–111)
GFR calc non Af Amer: >60 mL/min (ref >60)
GLUCOSE: 121 mg/dL — AB (ref 65–99)
Potassium: 3.4 mmol/L — ABNORMAL LOW (ref 3.5–5.1)
SODIUM: 138 mmol/L (ref 135–145)
TOTAL PROTEIN: 7.1 g/dL (ref 6.5–8.1)
Total Bilirubin: 1 mg/dL (ref 0.3–1.2)

## 2017-07-11 LAB — I-STAT CHEM 8, ED
BUN: 16 mg/dL (ref 6–20)
CHLORIDE: 105 mmol/L (ref 101–111)
Calcium, Ion: 1.07 mmol/L — ABNORMAL LOW (ref 1.15–1.40)
Creatinine, Ser: 0.9 mg/dL (ref 0.61–1.24)
GLUCOSE: 119 mg/dL — AB (ref 65–99)
HEMATOCRIT: 48 % (ref 39.0–52.0)
Hemoglobin: 16.3 g/dL (ref 13.0–17.0)
POTASSIUM: 3.4 mmol/L — AB (ref 3.5–5.1)
Sodium: 141 mmol/L (ref 135–145)
TCO2: 23 mmol/L (ref 22–32)

## 2017-07-11 LAB — CBC
HCT: 46.2 % (ref 39.0–52.0)
Hemoglobin: 15.4 g/dL (ref 13.0–17.0)
MCH: 29.1 pg (ref 26.0–34.0)
MCHC: 33.3 g/dL (ref 30.0–36.0)
MCV: 87.3 fL (ref 78.0–100.0)
PLATELETS: 209 10*3/uL (ref 150–400)
RBC: 5.29 MIL/uL (ref 4.22–5.81)
RDW: 12.7 % (ref 11.5–15.5)
WBC: 7.7 10*3/uL (ref 4.0–10.5)

## 2017-07-11 LAB — I-STAT CG4 LACTIC ACID, ED: Lactic Acid, Venous: 1.33 mmol/L (ref 0.5–1.9)

## 2017-07-11 LAB — CDS SEROLOGY

## 2017-07-11 LAB — PROTIME-INR
INR: 1.02
PROTHROMBIN TIME: 13.4 s (ref 11.4–15.2)

## 2017-07-11 LAB — ETHANOL

## 2017-07-11 MED ORDER — MORPHINE SULFATE (PF) 4 MG/ML IV SOLN
4.0000 mg | Freq: Once | INTRAVENOUS | Status: AC
  Administered 2017-07-11: 4 mg via INTRAVENOUS

## 2017-07-11 MED ORDER — MORPHINE SULFATE (PF) 4 MG/ML IV SOLN
INTRAVENOUS | Status: AC
  Administered 2017-07-11: 4 mg via INTRAVENOUS
  Filled 2017-07-11: qty 1

## 2017-07-11 MED ORDER — CYCLOBENZAPRINE HCL 5 MG PO TABS: 5.0000 mg | ORAL_TABLET | Freq: Three times a day (TID) | ORAL | 0 refills | Status: DC | PRN

## 2017-07-11 MED ORDER — MORPHINE SULFATE (PF) 4 MG/ML IV SOLN
4.0000 mg | Freq: Once | INTRAVENOUS | Status: AC
  Administered 2017-07-11: 4 mg via INTRAVENOUS
  Filled 2017-07-11: qty 1

## 2017-07-11 MED ORDER — OXYCODONE-ACETAMINOPHEN 5-325 MG PO TABS: 1.0000 | ORAL_TABLET | Freq: Four times a day (QID) | ORAL | 0 refills | Status: DC | PRN

## 2017-07-11 MED ORDER — IOPAMIDOL (ISOVUE-300) INJECTION 61%
INTRAVENOUS | Status: AC
  Administered 2017-07-11: 100 mL
  Filled 2017-07-11: qty 100

## 2017-07-11 MED ORDER — HYDROMORPHONE HCL 1 MG/ML IJ SOLN: 1.0000 mg | Freq: Once | INTRAMUSCULAR | Status: DC

## 2017-07-11 MED ORDER — OXYCODONE-ACETAMINOPHEN 5-325 MG PO TABS: 2.0000 | ORAL_TABLET | Freq: Once | ORAL | Status: DC

## 2017-07-11 MED ORDER — SODIUM CHLORIDE 0.9 % IV BOLUS (SEPSIS)
1000.0000 mL | Freq: Once | INTRAVENOUS | Status: AC
  Administered 2017-07-11: 1000 mL via INTRAVENOUS

## 2017-07-11 NOTE — ED Notes (Signed)
Family at bedside. 

## 2017-07-11 NOTE — ED Notes (Signed)
RN transporting patient to CT.
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
## 2017-07-11 NOTE — ED Notes (Signed)
Pt ambulated down hallway and spoke with EDP while walking. Smooth slow and steady gait.
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
## 2017-07-12 ENCOUNTER — Encounter (HOSPITAL_COMMUNITY): Payer: Self-pay | Admitting: Emergency Medicine

## 2017-09-07 ENCOUNTER — Other Ambulatory Visit: Payer: Self-pay

## 2017-09-07 ENCOUNTER — Encounter (HOSPITAL_COMMUNITY): Payer: Self-pay

## 2017-09-07 ENCOUNTER — Emergency Department (HOSPITAL_COMMUNITY)
Admission: EM | Admit: 2017-09-07 | Discharge: 2017-09-08 | Disposition: A | Discharge status: Home / Self Care | Payer: BLUE CROSS/BLUE SHIELD | Attending: Emergency Medicine | Admitting: Emergency Medicine

## 2017-09-07 DIAGNOSIS — I10 Essential (primary) hypertension: Secondary | ICD-10-CM | POA: Insufficient documentation

## 2017-09-07 DIAGNOSIS — K0889 Other specified disorders of teeth and supporting structures: Secondary | ICD-10-CM | POA: Insufficient documentation

## 2017-09-07 DIAGNOSIS — Z9114 Patient's other noncompliance with medication regimen: Secondary | ICD-10-CM | POA: Insufficient documentation

## 2017-09-07 DIAGNOSIS — R51 Headache: Secondary | ICD-10-CM | POA: Insufficient documentation

## 2017-09-07 DIAGNOSIS — Z79899 Other long term (current) drug therapy: Secondary | ICD-10-CM | POA: Insufficient documentation

## 2017-09-07 DIAGNOSIS — F17210 Nicotine dependence, cigarettes, uncomplicated: Secondary | ICD-10-CM | POA: Insufficient documentation

## 2017-09-07 DIAGNOSIS — R519 Headache, unspecified: Secondary | ICD-10-CM

## 2017-09-07 DIAGNOSIS — J45909 Unspecified asthma, uncomplicated: Secondary | ICD-10-CM | POA: Insufficient documentation

## 2017-09-07 MED ORDER — HYDROCODONE-ACETAMINOPHEN 5-325 MG PO TABS
1.0000 | ORAL_TABLET | Freq: Once | ORAL | Status: AC
  Administered 2017-09-07: 1 via ORAL
  Filled 2017-09-07: qty 1

## 2017-09-07 MED ORDER — KETOROLAC TROMETHAMINE 60 MG/2ML IM SOLN
60.0000 mg | Freq: Once | INTRAMUSCULAR | Status: AC
  Administered 2017-09-07: 60 mg via INTRAMUSCULAR
  Filled 2017-09-07: qty 2

## 2017-09-07 MED ORDER — CLINDAMYCIN HCL 150 MG PO CAPS: 450.0000 mg | ORAL_CAPSULE | Freq: Four times a day (QID) | ORAL | 0 refills | Status: DC

## 2017-09-07 NOTE — ED Triage Notes (Signed)
8%Pt arrived via EMS from home pt is c/o of HA x 2 days in the left side of head " behind my left eye"   EMS/ V/s 170/94 HR 80 RR 16, O2 sat 98%

## 2017-09-07 NOTE — Discharge Instructions (Signed)
You can take Tylenol or Ibuprofen as directed for pain. You can alternate Tylenol and Ibuprofen every 4 hours. If you take Tylenol at 1pm, then you can take Ibuprofen at 5pm. Then you can take Tylenol again at 9pm.   Take antibiotics as directed. Please take all of your antibiotics until finished.  As we discussed, you also need to follow-up with the [facility] regarding her high blood pressure.  You may need to restart on your medications.  As we discussed, you need to follow-up with a dentist.  If you would like to still follow-up with the dentist that you have scheduled an appointment with, see her on the previously scheduled time.  I have also provided with a dental resource guide that has dentist in the area that he can follow-up with.  Return to the emergency department for any worsening headache, numbness/weakness of her arms or legs, difficulty walking, vision changes, difficulty breathing, difficulty swallowing, facial redness or swelling or any other worsening or concerning symptoms.

## 2017-09-07 NOTE — ED Notes (Signed)
Pt states that the left side of his head and face is throbbing he is not sure of discomfort is coming from a tooth, and does reports that he feels a pressure behind his left eye.

## 2017-09-07 NOTE — ED Provider Notes (Signed)
[facility]-EMERGENCY DEPT Provider Note   CSN: [id number] Arrival date & time: 09/07/17  2126     History   Chief Complaint Chief Complaint  Patient presents with  . head ache    HPI Spencer Davis is a 26 y.o. male who presents with 2 days of left-sided headache and facial pain.  Patient reports that headache was gradual in nature and denies any thunderclap headache.  Patient reports that it is intermittent and describes it as a "throbbing/sharp pain."  He has taken Excedrin with minimal improvement in symptoms.  Patient is able to ambulate without any difficulty.  Patient states that he did notice that he had a cracked tooth on the upper left molar and does not know if that is the cause of his symptoms.  Patient denies any difficulty swallowing or eating.  Patient denies any fevers, chest pain, difficulty breathing, numbness/weakness of his arms or legs, vision changes, facial swelling or redness.  The history is provided by the patient.    Past Medical History:  Diagnosis Date  . Asthma   . Hypertension     There are no active problems to display for this patient.   History reviewed. No pertinent surgical history.     Home Medications    Prior to Admission medications   Medication Sig Start Date End Date Taking? Authorizing Provider  albuterol (PROVENTIL HFA;VENTOLIN HFA) 108 (90 Base) MCG/ACT inhaler Inhale 1 puff into the lungs every 6 (six) hours as needed for wheezing or shortness of breath.    [provider]  clindamycin (CLEOCIN) 150 MG capsule Take 3 capsules (450 mg total) by mouth every 6 (six) hours. 09/07/17   Maxwell Caul, PA-C  cyclobenzaprine (FLEXERIL) 5 MG tablet Take 1 tablet (5 mg total) by mouth 3 (three) times daily as needed for muscle spasms. 07/11/17   Levester Fresh, MD  diclofenac (VOLTAREN) 75 MG EC tablet Take 1 tablet (75 mg total) by mouth 2 (two) times daily. 06/20/17   Dorena Bodo, NP  diclofenac  (VOLTAREN) 75 MG EC tablet Take 75 mg by mouth 2 (two) times daily.    [provider]  doxycycline (DORYX) 100 MG EC tablet Take 100 mg by mouth 2 (two) times daily.    [provider]  doxycycline (VIBRAMYCIN) 100 MG capsule Take 1 capsule (100 mg total) by mouth 2 (two) times daily. 06/20/17   Dorena Bodo, NP  hydrochlorothiazide (HYDRODIURIL) 25 MG tablet Take 25 mg by mouth daily.    [provider]  HYDROcodone-acetaminophen (NORCO/VICODIN) 5-325 MG tablet Take 1 tablet by mouth every 6 (six) hours as needed. 01/19/17   Dorena Bodo, NP  lisinopril (PRINIVIL,ZESTRIL) 10 MG tablet Take 10 mg by mouth daily.    [provider]  loperamide (IMODIUM) 2 MG capsule Take 1 capsule (2 mg total) by mouth 4 (four) times daily as needed for diarrhea or loose stools. 09/19/16   Horton, Mayer Masker, MD  methocarbamol (ROBAXIN) 500 MG tablet Take 1 tablet (500 mg total) by mouth 4 (four) times daily. 06/20/17   Dorena Bodo, NP  methocarbamol (ROBAXIN) 500 MG tablet Take 500 mg by mouth 4 (four) times daily.    [provider]  ondansetron (ZOFRAN ODT) 4 MG disintegrating tablet Take 1 tablet (4 mg total) by mouth every 8 (eight) hours as needed for nausea or vomiting. 09/19/16   Horton, Mayer Masker, MD  oxyCODONE-acetaminophen (PERCOCET/ROXICET) 5-325 MG tablet Take 1-2 tablets by mouth every 6 (six)  hours as needed for severe pain. 07/11/17   Levester FreshHarris, Sarah, MD    Family History History reviewed. No pertinent family history.  Social History Social History   Tobacco Use  . Smoking status: Current Every Day Smoker    Packs/day: 0.25    Types: Cigarettes  . Smokeless tobacco: Never Used  Substance Use Topics  . Alcohol use: Yes    Comment: "rarely"  . Drug use: No     Allergies   Amoxicillin and Penicillins   Review of Systems Review of Systems  Constitutional: Negative for fever.  Respiratory: Negative for cough and shortness of breath.     Cardiovascular: Negative for chest pain.  Gastrointestinal: Negative for abdominal pain, nausea and vomiting.  Genitourinary: Negative for dysuria and hematuria.  Neurological: Positive for headaches.     Physical Exam Updated Vital Signs BP (!) 149/99 (BP Location: Left Arm)   Pulse 94   Temp 98 F (36.7 C) (Oral)   Resp 20   Ht 5\' 7"  (1.702 m)   Wt 118.8 kg (262 lb)   SpO2 100%   BMI 41.04 kg/m   Physical Exam  Constitutional: He is oriented to person, place, and time. He appears well-developed and well-nourished.  HENT:  Head: Normocephalic and atraumatic.  Mouth/Throat: Oropharynx is clear and moist and mucous membranes are normal.  Tooth #15 is partially cracked. Tenderness to palpation to the tooth.  No surrounding gingival erythema or swelling.  No evidence of abscess.  No fluctuance or mass noted.  No facial swelling or redness.  Eyes: Conjunctivae, EOM and lids are normal. Pupils are equal, round, and reactive to light.  Neck: Full passive range of motion without pain.  Cardiovascular: Normal rate, regular rhythm, normal heart sounds and normal pulses. Exam reveals no gallop and no friction rub.  No murmur heard. Pulmonary/Chest: Effort normal and breath sounds normal.  No evidence of respiratory distress. Able to speak in full sentences without difficulty.  Abdominal: Soft. Normal appearance. There is no tenderness. There is no rigidity and no guarding.  Musculoskeletal: Normal range of motion.  Neurological: He is alert and oriented to person, place, and time.  Cranial nerves III-XII intact Follows commands, Moves all extremities  5/5 strength to BUE and BLE  Sensation intact throughout all major nerve distributions Normal finger to nose. No dysdiadochokinesia. No pronator drift. No gait abnormalities  No slurred speech. No facial droop.   Skin: Skin is warm and dry. Capillary refill takes less than 2 seconds.  Psychiatric: He has a normal mood and affect.  His speech is normal.  Nursing note and vitals reviewed.    ED Treatments / Results  Labs (all labs ordered are listed, but only abnormal results are displayed) Labs Reviewed - No data to display  EKG  EKG Interpretation None       Radiology No results found.  Procedures Procedures (including critical care time)  Medications Ordered in ED Medications  ketorolac (TORADOL) injection 60 mg (60 mg Intramuscular Given 09/07/17 2225)  HYDROcodone-acetaminophen (NORCO/VICODIN) 5-325 MG per tablet 1 tablet (1 tablet Oral Given 09/07/17 2253)     Initial Impression / Assessment and Plan / ED Course  I have reviewed the triage vital signs and the nursing notes.  Pertinent labs & imaging results that were available during my care of the patient were reviewed by me and considered in my medical decision making (see chart for details).     26 y.o. M who presents with 2 days of  left-sided headache.  Patient reports that headache starts at the mouth and propped up into the left side of his face behind his eye.  Denies any thunderclap headache.  No trauma, fall, injury. Patient is afebrile, non-toxic appearing, sitting comfortably on examination table. Vital signs reviewed and stable.  Patient is slightly hypertensive on exam.  He reports a history of high blood pressure and states that he has not been taking his blood pressure medications for the last several months.  Physical exam is concerning for dental etiology as is #15 is partially cracked.  No evidence of dental abscess.  No red flag symptoms and history.  No neuro deficits on exam.  History/physical exam are not concerning for meningitis, caverounous sinus thrombosis, aneurysm. Do not suspect hypertensive urgency at this time. Suspect that headache is secondary to dental pain. Analgesics provided in the department.  Re-eValuation after pain medications in the department.  Patient reports improvement in headache.  Patient reports  feeling significantly improved and states that he is ready for discharge home.  Has an appointment with a dentist next week.  We will still give him dental resource guide to follow-up with.  No evidence of dental abscess at this moment but will plan to cover for potential dental abscess.  Patient is allergic to amoxicillin and penicillins.  Will provide appropriate coverage.  Will provide [facility] for patient to follow-up regarding her blood pressure. Patient had ample opportunity for questions and discussion. All patient's questions were answered with full understanding. Strict return precautions discussed. Patient expresses understanding and agreement to plan.    Final Clinical Impressions(s) / ED Diagnoses   Final diagnoses:  Acute nonintractable headache, unspecified headache type  Pain, dental    ED Discharge Orders        Ordered    clindamycin (CLEOCIN) 150 MG capsule  Every 6 hours     09/07/17 2246       Maxwell CaulLayden, Lindsey A, PA-C 09/09/17 0003    Melene PlanFloyd, Dan, DO 09/12/17 [phone]

## 2017-09-09 ENCOUNTER — Emergency Department (HOSPITAL_COMMUNITY): Payer: Self-pay

## 2017-09-09 ENCOUNTER — Emergency Department (HOSPITAL_COMMUNITY)
Admission: EM | Admit: 2017-09-09 | Discharge: 2017-09-09 | Disposition: A | Discharge status: Home / Self Care | Payer: Self-pay | Attending: Emergency Medicine | Admitting: Emergency Medicine

## 2017-09-09 ENCOUNTER — Encounter (HOSPITAL_COMMUNITY): Payer: Self-pay | Admitting: Emergency Medicine

## 2017-09-09 DIAGNOSIS — I10 Essential (primary) hypertension: Secondary | ICD-10-CM | POA: Insufficient documentation

## 2017-09-09 DIAGNOSIS — K0889 Other specified disorders of teeth and supporting structures: Secondary | ICD-10-CM | POA: Insufficient documentation

## 2017-09-09 DIAGNOSIS — K047 Periapical abscess without sinus: Secondary | ICD-10-CM

## 2017-09-09 DIAGNOSIS — J45909 Unspecified asthma, uncomplicated: Secondary | ICD-10-CM | POA: Insufficient documentation

## 2017-09-09 DIAGNOSIS — K029 Dental caries, unspecified: Secondary | ICD-10-CM | POA: Insufficient documentation

## 2017-09-09 DIAGNOSIS — F17210 Nicotine dependence, cigarettes, uncomplicated: Secondary | ICD-10-CM | POA: Insufficient documentation

## 2017-09-09 DIAGNOSIS — Z79899 Other long term (current) drug therapy: Secondary | ICD-10-CM | POA: Insufficient documentation

## 2017-09-09 MED ORDER — BENZOCAINE 10 % MT GEL
Freq: Once | OROMUCOSAL | Status: AC
  Administered 2017-09-09: 1 via OROMUCOSAL
  Filled 2017-09-09: qty 9.4

## 2017-09-09 MED ORDER — CYCLOBENZAPRINE HCL 10 MG PO TABS
10.0000 mg | ORAL_TABLET | Freq: Once | ORAL | Status: DC
  Filled 2017-09-09: qty 1

## 2017-09-09 MED ORDER — KETOROLAC TROMETHAMINE 60 MG/2ML IM SOLN
30.0000 mg | Freq: Once | INTRAMUSCULAR | Status: DC
  Filled 2017-09-09: qty 2

## 2017-09-09 MED ORDER — OXYCODONE-ACETAMINOPHEN 5-325 MG PO TABS
1.0000 | ORAL_TABLET | Freq: Once | ORAL | Status: AC
  Administered 2017-09-09: 1 via ORAL
  Filled 2017-09-09: qty 1

## 2017-09-09 NOTE — ED Triage Notes (Signed)
Pt reports worsening L upper dental pain since visit 2 days ago. Has not seen dentist in a year. No acute injury.

## 2017-09-09 NOTE — Discharge Instructions (Signed)
Call Dr. Lucky CowboyKnox in the morning and tell the office that you were here and we referred you to them. Continue to take the antibiotics and medication you are taking for pain.

## 2017-09-09 NOTE — ED Provider Notes (Signed)
[facility]-EMERGENCY DEPT Provider Note   CSN: [id number] Arrival date & time: 09/09/17  1555     History   Chief Complaint Chief Complaint  Patient presents with  . Dental Pain    HPI Spencer PiliJoshua Davis is a 26 y.o. male who presents to the ED with dental pain. The pain is in the left upper dental area. Patient was evaluated 2 days ago but came in for headache at that time and did not realize that the pain was all from the tooth. Patient reports he did start the clindamycin and ibuprofen. Today the pain in the left upper molar is sever. last dose of ibuprofen was at 1 pm and clindamycin at same time.    HPI  Past Medical History:  Diagnosis Date  . Asthma   . Hypertension     There are no active problems to display for this patient.   History reviewed. No pertinent surgical history.     Home Medications    Prior to Admission medications   Medication Sig Start Date End Date Taking? Authorizing Provider  albuterol (PROVENTIL HFA;VENTOLIN HFA) 108 (90 Base) MCG/ACT inhaler Inhale 1 puff into the lungs every 6 (six) hours as needed for wheezing or shortness of breath.    [provider]  clindamycin (CLEOCIN) 150 MG capsule Take 3 capsules (450 mg total) by mouth every 6 (six) hours. 09/07/17   Maxwell CaulLayden, Lindsey A, PA-C  cyclobenzaprine (FLEXERIL) 5 MG tablet Take 1 tablet (5 mg total) by mouth 3 (three) times daily as needed for muscle spasms. 07/11/17   Levester FreshHarris, Sarah, MD  diclofenac (VOLTAREN) 75 MG EC tablet Take 1 tablet (75 mg total) by mouth 2 (two) times daily. 06/20/17   Dorena BodoKennard, Lawrence, NP  diclofenac (VOLTAREN) 75 MG EC tablet Take 75 mg by mouth 2 (two) times daily.    [provider]  doxycycline (DORYX) 100 MG EC tablet Take 100 mg by mouth 2 (two) times daily.    [provider]  doxycycline (VIBRAMYCIN) 100 MG capsule Take 1 capsule (100 mg total) by mouth 2 (two) times daily. 06/20/17   Dorena BodoKennard, Lawrence, NP    hydrochlorothiazide (HYDRODIURIL) 25 MG tablet Take 25 mg by mouth daily.    [provider]  HYDROcodone-acetaminophen (NORCO/VICODIN) 5-325 MG tablet Take 1 tablet by mouth every 6 (six) hours as needed. 01/19/17   Dorena BodoKennard, Lawrence, NP  lisinopril (PRINIVIL,ZESTRIL) 10 MG tablet Take 10 mg by mouth daily.    [provider]  loperamide (IMODIUM) 2 MG capsule Take 1 capsule (2 mg total) by mouth 4 (four) times daily as needed for diarrhea or loose stools. 09/19/16   Horton, Mayer Maskerourtney F, MD  methocarbamol (ROBAXIN) 500 MG tablet Take 1 tablet (500 mg total) by mouth 4 (four) times daily. 06/20/17   Dorena BodoKennard, Lawrence, NP  methocarbamol (ROBAXIN) 500 MG tablet Take 500 mg by mouth 4 (four) times daily.    [provider]  ondansetron (ZOFRAN ODT) 4 MG disintegrating tablet Take 1 tablet (4 mg total) by mouth every 8 (eight) hours as needed for nausea or vomiting. 09/19/16   Horton, Mayer Maskerourtney F, MD  oxyCODONE-acetaminophen (PERCOCET/ROXICET) 5-325 MG tablet Take 1-2 tablets by mouth every 6 (six) hours as needed for severe pain. 07/11/17   Levester FreshHarris, Sarah, MD    Family History History reviewed. No pertinent family history.  Social History Social History   Tobacco Use  . Smoking status: Current Every Day Smoker    Packs/day: 0.25  Types: Cigarettes  . Smokeless tobacco: Never Used  Substance Use Topics  . Alcohol use: Yes    Comment: "rarely"  . Drug use: No     Allergies   Amoxicillin and Penicillins   Review of Systems Review of Systems  Constitutional: Negative for chills and fever.  HENT: Positive for dental problem and ear pain (left). Negative for sore throat.   Respiratory: Negative for shortness of breath.   Gastrointestinal: Negative for nausea and vomiting.  Musculoskeletal: Negative for myalgias.  Skin: Negative for rash.  Neurological: Positive for headaches (left side of face). Negative for syncope.  Psychiatric/Behavioral: Negative for  confusion.     Physical Exam Updated Vital Signs BP (!) 133/93   Pulse 95   Temp 98 F (36.7 C) (Oral)   Resp 20   SpO2 98%   Physical Exam  Constitutional: He appears well-developed and well-nourished.  HENT:  Head: Normocephalic.  Right Ear: Tympanic membrane normal.  Left Ear: Tympanic membrane normal.  Nose: Nose normal.  Mouth/Throat: Uvula is midline and oropharynx is clear and moist. Abnormal dentition. Dental caries present.    Left upper first molar broken and decayed. Tender on exam. Gum surrounding the tooth with erythema and swelling.   Eyes: EOM are normal.  Neck: Normal range of motion. Neck supple.  Cardiovascular: Normal rate.  Pulmonary/Chest: Effort normal.  Musculoskeletal: Normal range of motion.  Lymphadenopathy:    He has no cervical adenopathy.  Neurological: He is alert.  Skin: Skin is warm and dry.  Psychiatric: He has a normal mood and affect.  Nursing note and vitals reviewed.    ED Treatments / Results  Labs (all labs ordered are listed, but only abnormal results are displayed) Labs Reviewed - No data to display  Radiology No results found.  Procedures Procedures (including critical care time)  Medications Ordered in ED Medications  oxyCODONE-acetaminophen (PERCOCET/ROXICET) 5-325 MG per tablet 1 tablet (1 tablet Oral Given 09/09/17 2027)  benzocaine (ORAJEL) 10 % mucosal gel (1 application Mouth/Throat Given by Other 09/09/17 2027)     Initial Impression / Assessment and Plan / ED Course  I have reviewed the triage vital signs and the nursing notes. Patient with toothache.  No gross abscess.  Exam unconcerning for Ludwig's angina or spread of infection.  Patient to continue antibiotics and ibuprofen.   Urged patient to follow-up with dentist.  Referral given.  Final Clinical Impressions(s) / ED Diagnoses   Final diagnoses:  Infected dental caries  Toothache    ED Discharge Orders    None       Kerrie Buffaloeese, Hope GamercoM,  [place]NP 09/09/17 2029    Maia PlanLong, Cailean G, MD 09/10/17 1150

## 2017-11-23 ENCOUNTER — Ambulatory Visit (HOSPITAL_COMMUNITY)
Admission: EM | Admit: 2017-11-23 | Discharge: 2017-11-23 | Disposition: A | Discharge status: Home / Self Care | Payer: Self-pay | Attending: Family Medicine | Admitting: Family Medicine

## 2017-11-23 ENCOUNTER — Encounter (HOSPITAL_COMMUNITY): Payer: Self-pay | Admitting: Emergency Medicine

## 2017-11-23 DIAGNOSIS — Z880 Allergy status to penicillin: Secondary | ICD-10-CM | POA: Insufficient documentation

## 2017-11-23 DIAGNOSIS — J029 Acute pharyngitis, unspecified: Secondary | ICD-10-CM

## 2017-11-23 DIAGNOSIS — R05 Cough: Secondary | ICD-10-CM

## 2017-11-23 DIAGNOSIS — R197 Diarrhea, unspecified: Secondary | ICD-10-CM | POA: Insufficient documentation

## 2017-11-23 DIAGNOSIS — R10817 Generalized abdominal tenderness: Secondary | ICD-10-CM | POA: Insufficient documentation

## 2017-11-23 DIAGNOSIS — F17210 Nicotine dependence, cigarettes, uncomplicated: Secondary | ICD-10-CM | POA: Insufficient documentation

## 2017-11-23 DIAGNOSIS — R112 Nausea with vomiting, unspecified: Secondary | ICD-10-CM | POA: Insufficient documentation

## 2017-11-23 LAB — POCT RAPID STREP A: Streptococcus, Group A Screen (Direct): NEGATIVE

## 2017-11-23 MED ORDER — ONDANSETRON 4 MG PO TBDP: 4.0000 mg | ORAL_TABLET | Freq: Three times a day (TID) | ORAL | 0 refills | Status: DC | PRN

## 2017-11-23 MED ORDER — ONDANSETRON 4 MG PO TBDP
ORAL_TABLET | ORAL | Status: AC
  Filled 2017-11-23: qty 1

## 2017-11-23 MED ORDER — ONDANSETRON 4 MG PO TBDP
4.0000 mg | ORAL_TABLET | Freq: Once | ORAL | Status: AC
  Administered 2017-11-23: 4 mg via ORAL

## 2017-11-23 MED ORDER — DICYCLOMINE HCL 20 MG PO TABS: 20.0000 mg | ORAL_TABLET | Freq: Two times a day (BID) | ORAL | 0 refills | Status: DC

## 2017-11-23 NOTE — ED Triage Notes (Signed)
PT C/O: abd pain onset 2 days associated w/diarrhea, vomiting, sore throat   TAKING MEDS: Thera flu   A&O x4... NAD... Ambulatory

## 2017-11-23 NOTE — ED Provider Notes (Signed)
[facility]    CSN: [id number] Arrival date & time: 11/23/17  1500     History   Chief Complaint Chief Complaint  Patient presents with  . Abdominal Pain    HPI Spencer Davis is a 27 y.o. male.   27 year old male comes in for 2-day history of abdominal cramping, nausea, vomiting, diarrhea.  States symptoms happened after eating undercooked chicken 3 days ago.  His partner also had the same food, and with same symptoms.  Denies fever, chills, night sweats.  Has also had sore throat with mild cough.  Denies rhinorrhea, nasal congestion.  States about 9 episodes of non-bilious nonbloody vomiting per throughout the past 2 days.  Has had watery diarrhea, denies blood in stool, recent antibiotic use.  Denies weakness, dizziness, syncope.  Has been taking TheraFlu without relief.  Current every day smoker.      Past Medical History:  Diagnosis Date  . Asthma   . Hypertension     There are no active problems to display for this patient.   History reviewed. No pertinent surgical history.     Home Medications    Prior to Admission medications   Medication Sig Start Date End Date Taking? Authorizing Provider  dicyclomine (BENTYL) 20 MG tablet Take 1 tablet (20 mg total) by mouth 2 (two) times daily. 11/23/17   Cathie Hoops, Bowden Boody V, PA-C  ondansetron (ZOFRAN ODT) 4 MG disintegrating tablet Take 1 tablet (4 mg total) by mouth every 8 (eight) hours as needed for nausea or vomiting. 11/23/17   Belinda Fisher, PA-C    Family History History reviewed. No pertinent family history.  Social History Social History   Tobacco Use  . Smoking status: Current Every Day Smoker    Packs/day: 0.25    Types: Cigarettes  . Smokeless tobacco: Never Used  Substance Use Topics  . Alcohol use: Yes    Comment: "rarely"  . Drug use: No     Allergies   Amoxicillin and Penicillins   Review of Systems Review of Systems  Reason unable to perform ROS: See HPI as above.     Physical  Exam Triage Vital Signs ED Triage Vitals [11/23/17 1641]  Enc Vitals Group     BP      Pulse Rate 74     Resp 16     Temp 98.3 F (36.8 C)     Temp Source Oral     SpO2 100 %     Weight      Height      Head Circumference      Peak Flow      Pain Score 7     Pain Loc      Pain Edu?      Excl. in GC?    No data found.  Updated Vital Signs BP (!) 104/56 (BP Location: Left Arm)   Pulse 74   Temp 98.3 F (36.8 C) (Oral)   Resp 16   SpO2 100%   Physical Exam  Constitutional: He is oriented to person, place, and time. He appears well-developed and well-nourished. No distress.  HENT:  Head: Normocephalic and atraumatic.  Right Ear: Tympanic membrane, external ear and ear canal normal. Tympanic membrane is not erythematous and not bulging.  Left Ear: Tympanic membrane, external ear and ear canal normal. Tympanic membrane is not erythematous and not bulging.  Nose: Mucosal edema present. Right sinus exhibits no maxillary sinus tenderness and no frontal sinus tenderness. Left sinus exhibits no maxillary  sinus tenderness and no frontal sinus tenderness.  Mouth/Throat: Uvula is midline and mucous membranes are normal. Posterior oropharyngeal erythema present. No tonsillar exudate.  Eyes: Conjunctivae are normal. Pupils are equal, round, and reactive to light.  Neck: Normal range of motion. Neck supple.  Cardiovascular: Normal rate, regular rhythm and normal heart sounds. Exam reveals no gallop and no friction rub.  No murmur heard. Pulmonary/Chest: Effort normal and breath sounds normal. He has no decreased breath sounds. He has no wheezes. He has no rhonchi. He has no rales.  Abdominal: Soft. Bowel sounds are normal. There is tenderness (generalized tenderness on palpation without guarding or rebound).  Lymphadenopathy:    He has no cervical adenopathy.  Neurological: He is alert and oriented to person, place, and time.  Skin: Skin is warm and dry.  Psychiatric: He has a normal  mood and affect. His behavior is normal. Judgment normal.     UC Treatments / Results  Labs (all labs ordered are listed, but only abnormal results are displayed) Labs Reviewed  CULTURE, GROUP A STREP [facility]  POCT RAPID STREP A    EKG  EKG Interpretation None       Radiology No results found.  Procedures Procedures (including critical care time)  Medications Ordered in UC Medications  ondansetron (ZOFRAN-ODT) disintegrating tablet 4 mg (4 mg Oral Given 11/23/17 1727)     Initial Impression / Assessment and Plan / UC Course  I have reviewed the triage vital signs and the nursing notes.  Pertinent labs & imaging results that were available during my care of the patient were reviewed by me and considered in my medical decision making (see chart for details).    Rapid strep negative. Discussed with patient no alarming signs on exam. Zofran for nausea. Bentyl for abdominal cramping. Push fluids. Bland diet, advance as tolerated. Return precautions given.   Final Clinical Impressions(s) / UC Diagnoses   Final diagnoses:  Nausea vomiting and diarrhea    ED Discharge Orders        Ordered    ondansetron (ZOFRAN ODT) 4 MG disintegrating tablet  Every 8 hours PRN     11/23/17 1737    dicyclomine (BENTYL) 20 MG tablet  2 times daily     11/23/17 1737        Belinda FisherYu, Travus Oren V, PA-C 11/23/17 1756

## 2017-11-23 NOTE — Discharge Instructions (Signed)
Rapid strep negative. Zofran for nausea and vomiting as needed. Bentyl for stomach cramping. Keep hydrated, you urine should be clear to pale yellow in color. Bland diet as attached, advance as tolerated. Monitor for any worsening of symptoms, nausea or vomiting not controlled by medication, worsening abdominal pain, fever, follow-up for reevaluation.

## 2017-11-26 LAB — CULTURE, GROUP A STREP (THRC)

## 2017-12-31 ENCOUNTER — Ambulatory Visit (HOSPITAL_COMMUNITY)
Admission: EM | Admit: 2017-12-31 | Discharge: 2017-12-31 | Disposition: A | Discharge status: Home / Self Care | Payer: Self-pay | Attending: Family Medicine | Admitting: Family Medicine

## 2017-12-31 ENCOUNTER — Encounter (HOSPITAL_COMMUNITY): Payer: Self-pay | Admitting: Emergency Medicine

## 2017-12-31 DIAGNOSIS — Z202 Contact with and (suspected) exposure to infections with a predominantly sexual mode of transmission: Secondary | ICD-10-CM

## 2017-12-31 DIAGNOSIS — Z711 Person with feared health complaint in whom no diagnosis is made: Secondary | ICD-10-CM

## 2017-12-31 DIAGNOSIS — I10 Essential (primary) hypertension: Secondary | ICD-10-CM | POA: Insufficient documentation

## 2017-12-31 DIAGNOSIS — Z79899 Other long term (current) drug therapy: Secondary | ICD-10-CM | POA: Insufficient documentation

## 2017-12-31 DIAGNOSIS — F17210 Nicotine dependence, cigarettes, uncomplicated: Secondary | ICD-10-CM | POA: Insufficient documentation

## 2017-12-31 DIAGNOSIS — Z880 Allergy status to penicillin: Secondary | ICD-10-CM | POA: Insufficient documentation

## 2017-12-31 DIAGNOSIS — Z8619 Personal history of other infectious and parasitic diseases: Secondary | ICD-10-CM | POA: Insufficient documentation

## 2017-12-31 DIAGNOSIS — J45909 Unspecified asthma, uncomplicated: Secondary | ICD-10-CM | POA: Insufficient documentation

## 2017-12-31 DIAGNOSIS — L298 Other pruritus: Secondary | ICD-10-CM | POA: Insufficient documentation

## 2017-12-31 DIAGNOSIS — Z113 Encounter for screening for infections with a predominantly sexual mode of transmission: Secondary | ICD-10-CM

## 2017-12-31 DIAGNOSIS — N4889 Other specified disorders of penis: Secondary | ICD-10-CM

## 2017-12-31 MED ORDER — AZITHROMYCIN 250 MG PO TABS
1000.0000 mg | ORAL_TABLET | Freq: Once | ORAL | Status: AC
  Administered 2017-12-31: 1000 mg via ORAL

## 2017-12-31 MED ORDER — METRONIDAZOLE 500 MG PO TABS: 2000.0000 mg | ORAL_TABLET | Freq: Once | ORAL | 0 refills | Status: AC

## 2017-12-31 MED ORDER — AZITHROMYCIN 250 MG PO TABS
ORAL_TABLET | ORAL | Status: AC
  Filled 2017-12-31: qty 4

## 2017-12-31 MED ORDER — CEFTRIAXONE SODIUM 250 MG IJ SOLR
INTRAMUSCULAR | Status: AC
  Filled 2017-12-31: qty 250

## 2017-12-31 MED ORDER — LIDOCAINE HCL (PF) 1 % IJ SOLN
INTRAMUSCULAR | Status: AC
  Filled 2017-12-31: qty 2

## 2017-12-31 MED ORDER — CEFTRIAXONE SODIUM 250 MG IJ SOLR
250.0000 mg | Freq: Once | INTRAMUSCULAR | Status: AC
  Administered 2017-12-31: 250 mg via INTRAMUSCULAR

## 2017-12-31 NOTE — ED Provider Notes (Signed)
[facility]    CSN: [id number] Arrival date & time: 12/31/17  1851     History   Chief Complaint Chief Complaint  Patient presents with  . Exposure to STD    HPI Spencer Davis is a 27 y.o. male.   Spencer Davis presents with complaints of "itching" sensation to inside of penis. He states he had similar once in the past which was trichomonas. Denies any rash, open lesions, sores, redness or swelling to scrotum. Denies penile discharge. Denies abdominal pain, urinary symptoms, back pain. He is sexually active with one partner, does not use condoms. No specific known exposure to std's but states he is concerned about this. Has has chlamydia in the past as well.   ROS per HPI.          Past Medical History:  Diagnosis Date  . Asthma   . Hypertension     There are no active problems to display for this patient.   History reviewed. No pertinent surgical history.     Home Medications    Prior to Admission medications   Medication Sig Start Date End Date Taking? Authorizing Provider  dicyclomine (BENTYL) 20 MG tablet Take 1 tablet (20 mg total) by mouth 2 (two) times daily. 11/23/17   Cathie Hoops, Amy V, PA-C  metroNIDAZOLE (FLAGYL) 500 MG tablet Take 4 tablets (2,000 mg total) by mouth once for 1 dose. 12/31/17 12/31/17  Georgetta Haber, NP  ondansetron (ZOFRAN ODT) 4 MG disintegrating tablet Take 1 tablet (4 mg total) by mouth every 8 (eight) hours as needed for nausea or vomiting. 11/23/17   Belinda Fisher, PA-C    Family History History reviewed. No pertinent family history.  Social History Social History   Tobacco Use  . Smoking status: Current Every Day Smoker    Packs/day: 0.25    Types: Cigarettes  . Smokeless tobacco: Never Used  Substance Use Topics  . Alcohol use: Yes    Comment: "rarely"  . Drug use: No     Allergies   Amoxicillin and Penicillins   Review of Systems Review of Systems   Physical Exam Triage Vital Signs ED Triage Vitals [12/31/17  1933]  Enc Vitals Group     BP (!) 157/98     Pulse Rate 90     Resp 18     Temp 98.1 F (36.7 C)     Temp Source Oral     SpO2 100 %     Weight      Height      Head Circumference      Peak Flow      Pain Score 0     Pain Loc      Pain Edu?      Excl. in GC?    No data found.  Updated Vital Signs BP (!) 157/98 (BP Location: Right Arm)   Pulse 90   Temp 98.1 F (36.7 C) (Oral)   Resp 18   SpO2 100%   Visual Acuity Right Eye Distance:   Left Eye Distance:   Bilateral Distance:    Right Eye Near:   Left Eye Near:    Bilateral Near:     Physical Exam  Constitutional: He is oriented to person, place, and time. He appears well-developed and well-nourished.  Cardiovascular: Normal rate and regular rhythm.  Pulmonary/Chest: Effort normal and breath sounds normal.  Abdominal: Soft. He exhibits no distension. There is no tenderness.  Genitourinary: Testes normal and penis normal. Uncircumcised.  Neurological: He is alert and oriented to person, place, and time.  Skin: Skin is warm and dry.     UC Treatments / Results  Labs (all labs ordered are listed, but only abnormal results are displayed) Labs Reviewed  RPR  HIV ANTIBODY (ROUTINE TESTING)  URINE CYTOLOGY ANCILLARY ONLY    EKG  EKG Interpretation None       Radiology No results found.  Procedures Procedures (including critical care time)  Medications Ordered in UC Medications  azithromycin (ZITHROMAX) tablet 1,000 mg (not administered)  cefTRIAXone (ROCEPHIN) injection 250 mg (not administered)     Initial Impression / Assessment and Plan / UC Course  I have reviewed the triage vital signs and the nursing notes.  Pertinent labs & imaging results that were available during my care of the patient were reviewed by me and considered in my medical decision making (see chart for details).     Empiric azithromycin and rocephin provided tonight as well as prescribed one time 2gm of flagyl. Will  notify of any positive findings and if any changes to treatment are needed, urine cytology pending. If symptoms worsen or do not improve in the next week to return to be seen or to follow up with PCP.  Patient verbalized understanding and agreeable to plan.  Safe sex practices discussed.   Final Clinical Impressions(s) / UC Diagnoses   Final diagnoses:  Concern about STD in male without diagnosis    ED Discharge Orders        Ordered    metroNIDAZOLE (FLAGYL) 500 MG tablet   Once     12/31/17 2006       Controlled Substance Prescriptions [place] Controlled Substance Registry consulted? Not Applicable   Georgetta HaberBurky, Anaelle Dunton B, NP 12/31/17 2012

## 2017-12-31 NOTE — ED Triage Notes (Signed)
Pt sts itching in groin area

## 2017-12-31 NOTE — Discharge Instructions (Signed)
We have treated you today for gonorrhea and chlamydia. I have sent medications to the pharmacy to treat trichomonas. Will notify you of any positive findings from your testing and if any changes to treatment are needed.   If symptoms worsen or do not improve in the next week to return to be seen or to follow up with PCP.   Please withhold from intercourse for the next week. Please use condoms to prevent STD's.

## 2018-01-01 LAB — RPR: RPR Ser Ql: NONREACTIVE

## 2018-01-01 LAB — URINE CYTOLOGY ANCILLARY ONLY
CHLAMYDIA, DNA PROBE: NEGATIVE
NEISSERIA GONORRHEA: NEGATIVE
Trichomonas: NEGATIVE

## 2018-01-01 LAB — HIV ANTIBODY (ROUTINE TESTING W REFLEX): HIV SCREEN 4TH GENERATION: NONREACTIVE

## 2018-02-10 ENCOUNTER — Emergency Department (HOSPITAL_COMMUNITY): Payer: Self-pay

## 2018-02-10 ENCOUNTER — Emergency Department (HOSPITAL_COMMUNITY)
Admission: EM | Admit: 2018-02-10 | Discharge: 2018-02-10 | Disposition: A | Discharge status: Home / Self Care | Payer: Self-pay | Attending: Emergency Medicine | Admitting: Emergency Medicine

## 2018-02-10 ENCOUNTER — Encounter (HOSPITAL_COMMUNITY): Payer: Self-pay

## 2018-02-10 DIAGNOSIS — I10 Essential (primary) hypertension: Secondary | ICD-10-CM | POA: Insufficient documentation

## 2018-02-10 DIAGNOSIS — W268XXA Contact with other sharp object(s), not elsewhere classified, initial encounter: Secondary | ICD-10-CM | POA: Insufficient documentation

## 2018-02-10 DIAGNOSIS — F17210 Nicotine dependence, cigarettes, uncomplicated: Secondary | ICD-10-CM | POA: Insufficient documentation

## 2018-02-10 DIAGNOSIS — IMO0002 Reserved for concepts with insufficient information to code with codable children: Secondary | ICD-10-CM

## 2018-02-10 DIAGNOSIS — Y999 Unspecified external cause status: Secondary | ICD-10-CM | POA: Insufficient documentation

## 2018-02-10 DIAGNOSIS — S66822A Laceration of other specified muscles, fascia and tendons at wrist and hand level, left hand, initial encounter: Secondary | ICD-10-CM | POA: Insufficient documentation

## 2018-02-10 DIAGNOSIS — J45909 Unspecified asthma, uncomplicated: Secondary | ICD-10-CM | POA: Insufficient documentation

## 2018-02-10 DIAGNOSIS — Y929 Unspecified place or not applicable: Secondary | ICD-10-CM | POA: Insufficient documentation

## 2018-02-10 DIAGNOSIS — Y9389 Activity, other specified: Secondary | ICD-10-CM | POA: Insufficient documentation

## 2018-02-10 MED ORDER — TRAMADOL HCL 50 MG PO TABS: 50.0000 mg | ORAL_TABLET | Freq: Four times a day (QID) | ORAL | 0 refills | Status: DC | PRN

## 2018-02-10 MED ORDER — CEPHALEXIN 250 MG PO CAPS
500.0000 mg | ORAL_CAPSULE | Freq: Once | ORAL | Status: AC
  Administered 2018-02-10: 500 mg via ORAL
  Filled 2018-02-10: qty 2

## 2018-02-10 MED ORDER — LIDOCAINE-EPINEPHRINE 2 %-1:100000 IJ SOLN
20.0000 mL | Freq: Once | INTRAMUSCULAR | Status: AC
  Administered 2018-02-10: 20 mL
  Filled 2018-02-10: qty 20

## 2018-02-10 MED ORDER — CEPHALEXIN 500 MG PO CAPS: 500.0000 mg | ORAL_CAPSULE | Freq: Two times a day (BID) | ORAL | 0 refills | Status: DC
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
## 2018-02-10 NOTE — Discharge Instructions (Addendum)
Please read attached information. If you experience any new or worsening signs or symptoms please return to the emergency room for evaluation. Please follow-up with your primary care provider or specialist as discussed. Please use medication prescribed only as directed and discontinue taking if you have any concerning signs or symptoms.   °

## 2018-02-10 NOTE — ED Notes (Signed)
ED Provider at bedside. 

## 2018-02-10 NOTE — Progress Notes (Signed)
Orthopedic Tech Progress Note Patient Details:  Spencer Davis [birth year] [id number]  Ortho Devices Type of Ortho Device: Ace wrap, Volar splint Ortho Device/Splint Interventions: Application   Post Interventions Patient Tolerated: Well Instructions Provided: Care of device   Saul Fordyce 02/10/2018, 5:55 PM

## 2018-02-23 ENCOUNTER — Emergency Department (HOSPITAL_COMMUNITY)
Admission: EM | Admit: 2018-02-23 | Discharge: 2018-02-23 | Disposition: A | Discharge status: Home / Self Care | Attending: Emergency Medicine | Admitting: Emergency Medicine

## 2018-02-23 ENCOUNTER — Emergency Department (HOSPITAL_COMMUNITY)

## 2018-02-23 ENCOUNTER — Encounter (HOSPITAL_COMMUNITY): Payer: Self-pay | Admitting: *Deleted

## 2018-02-23 ENCOUNTER — Other Ambulatory Visit: Payer: Self-pay

## 2018-02-23 DIAGNOSIS — M542 Cervicalgia: Secondary | ICD-10-CM | POA: Insufficient documentation

## 2018-02-23 DIAGNOSIS — I10 Essential (primary) hypertension: Secondary | ICD-10-CM | POA: Diagnosis not present

## 2018-02-23 DIAGNOSIS — R55 Syncope and collapse: Secondary | ICD-10-CM | POA: Diagnosis present

## 2018-02-23 DIAGNOSIS — Z79899 Other long term (current) drug therapy: Secondary | ICD-10-CM | POA: Diagnosis not present

## 2018-02-23 DIAGNOSIS — R251 Tremor, unspecified: Secondary | ICD-10-CM | POA: Diagnosis not present

## 2018-02-23 DIAGNOSIS — J45909 Unspecified asthma, uncomplicated: Secondary | ICD-10-CM | POA: Insufficient documentation

## 2018-02-23 DIAGNOSIS — W19XXXA Unspecified fall, initial encounter: Secondary | ICD-10-CM

## 2018-02-23 LAB — CBC WITH DIFFERENTIAL/PLATELET
BASOS ABS: 0 10*3/uL (ref 0.0–0.1)
Basophils Relative: 0 %
EOS PCT: 1 %
Eosinophils Absolute: 0 10*3/uL (ref 0.0–0.7)
HEMATOCRIT: 46.2 % (ref 39.0–52.0)
Hemoglobin: 15.5 g/dL (ref 13.0–17.0)
LYMPHS ABS: 2 10*3/uL (ref 0.7–4.0)
LYMPHS PCT: 30 %
MCH: 29.5 pg (ref 26.0–34.0)
MCHC: 33.5 g/dL (ref 30.0–36.0)
MCV: 87.8 fL (ref 78.0–100.0)
MONO ABS: 0.4 10*3/uL (ref 0.1–1.0)
MONOS PCT: 5 %
Neutro Abs: 4.4 10*3/uL (ref 1.7–7.7)
Neutrophils Relative %: 64 %
Platelets: 226 10*3/uL (ref 150–400)
RBC: 5.26 MIL/uL (ref 4.22–5.81)
RDW: 12.7 % (ref 11.5–15.5)
WBC: 6.9 10*3/uL (ref 4.0–10.5)

## 2018-02-23 LAB — URINALYSIS, ROUTINE W REFLEX MICROSCOPIC
Bilirubin Urine: NEGATIVE
Glucose, UA: NEGATIVE mg/dL
Hgb urine dipstick: NEGATIVE
KETONES UR: NEGATIVE mg/dL
LEUKOCYTES UA: NEGATIVE
Nitrite: NEGATIVE
PH: 8 (ref 5.0–8.0)
Protein, ur: NEGATIVE mg/dL
SPECIFIC GRAVITY, URINE: 1.011 (ref 1.005–1.030)

## 2018-02-23 LAB — COMPREHENSIVE METABOLIC PANEL
ALT: 24 U/L (ref 17–63)
AST: 17 U/L (ref 15–41)
Albumin: 4.2 g/dL (ref 3.5–5.0)
Alkaline Phosphatase: 78 U/L (ref 38–126)
Anion gap: 7 (ref 5–15)
BILIRUBIN TOTAL: 0.9 mg/dL (ref 0.3–1.2)
BUN: 9 mg/dL (ref 6–20)
CO2: 29 mmol/L (ref 22–32)
Calcium: 9.5 mg/dL (ref 8.9–10.3)
Chloride: 103 mmol/L (ref 101–111)
Creatinine, Ser: 0.78 mg/dL (ref 0.61–1.24)
GFR calc Af Amer: >60 mL/min (ref >60)
GFR calc non Af Amer: >60 mL/min (ref >60)
GLUCOSE: 103 mg/dL — AB (ref 65–99)
POTASSIUM: 4.5 mmol/L (ref 3.5–5.1)
SODIUM: 139 mmol/L (ref 135–145)
TOTAL PROTEIN: 7.8 g/dL (ref 6.5–8.1)

## 2018-02-23 LAB — RAPID URINE DRUG SCREEN, HOSP PERFORMED
AMPHETAMINES: NOT DETECTED
BARBITURATES: NOT DETECTED
BENZODIAZEPINES: NOT DETECTED
Cocaine: NOT DETECTED
Opiates: NOT DETECTED
TETRAHYDROCANNABINOL: NOT DETECTED

## 2018-02-23 LAB — ETHANOL: Alcohol, Ethyl (B): <10 mg/dL (ref <10)
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
## 2018-02-23 NOTE — Discharge Instructions (Addendum)
Take your usual prescriptions as previously directed.  Call your regular medical doctor and the Neurologist on Monday to schedule a follow up appointment within the next week.  Return to the Emergency Department immediately sooner if worsening.

## 2018-02-23 NOTE — ED Triage Notes (Signed)
Bystander noted patient to have "seizure", unable to note length of time of seizure.  Fall onto left hand and mid back no reports of hitting head.  No postictal state, no incontinence.  Headache as stated by patient does have history of migraines.
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
## 2018-12-08 ENCOUNTER — Other Ambulatory Visit: Payer: Self-pay

## 2018-12-08 ENCOUNTER — Emergency Department (HOSPITAL_COMMUNITY)

## 2018-12-08 ENCOUNTER — Encounter (HOSPITAL_COMMUNITY): Payer: Self-pay | Admitting: Emergency Medicine

## 2018-12-08 ENCOUNTER — Emergency Department (HOSPITAL_COMMUNITY)
Admission: EM | Admit: 2018-12-08 | Discharge: 2018-12-08 | Disposition: A | Discharge status: Home / Self Care | Attending: Emergency Medicine | Admitting: Emergency Medicine

## 2018-12-08 DIAGNOSIS — G40909 Epilepsy, unspecified, not intractable, without status epilepticus: Secondary | ICD-10-CM | POA: Insufficient documentation

## 2018-12-08 DIAGNOSIS — Y929 Unspecified place or not applicable: Secondary | ICD-10-CM | POA: Diagnosis not present

## 2018-12-08 DIAGNOSIS — X58XXXA Exposure to other specified factors, initial encounter: Secondary | ICD-10-CM | POA: Diagnosis not present

## 2018-12-08 DIAGNOSIS — I10 Essential (primary) hypertension: Secondary | ICD-10-CM | POA: Insufficient documentation

## 2018-12-08 DIAGNOSIS — Z79899 Other long term (current) drug therapy: Secondary | ICD-10-CM | POA: Insufficient documentation

## 2018-12-08 DIAGNOSIS — S199XXA Unspecified injury of neck, initial encounter: Secondary | ICD-10-CM | POA: Diagnosis present

## 2018-12-08 DIAGNOSIS — Z87891 Personal history of nicotine dependence: Secondary | ICD-10-CM | POA: Diagnosis not present

## 2018-12-08 DIAGNOSIS — Y999 Unspecified external cause status: Secondary | ICD-10-CM | POA: Insufficient documentation

## 2018-12-08 DIAGNOSIS — G40919 Epilepsy, unspecified, intractable, without status epilepticus: Secondary | ICD-10-CM

## 2018-12-08 DIAGNOSIS — Y939 Activity, unspecified: Secondary | ICD-10-CM | POA: Insufficient documentation

## 2018-12-08 DIAGNOSIS — S161XXA Strain of muscle, fascia and tendon at neck level, initial encounter: Secondary | ICD-10-CM | POA: Insufficient documentation

## 2018-12-08 HISTORY — DX: Unspecified injury of head, initial encounter: S09.90XA

## 2018-12-08 HISTORY — DX: Unspecified convulsions: R56.9

## 2018-12-08 LAB — COMPREHENSIVE METABOLIC PANEL
ALT: 35 U/L (ref 0–44)
ANION GAP: 7 (ref 5–15)
AST: 20 U/L (ref 15–41)
Albumin: 4.5 g/dL (ref 3.5–5.0)
Alkaline Phosphatase: 92 U/L (ref 38–126)
BUN: 9 mg/dL (ref 6–20)
CO2: 28 mmol/L (ref 22–32)
Calcium: 9.5 mg/dL (ref 8.9–10.3)
Chloride: 103 mmol/L (ref 98–111)
Creatinine, Ser: 0.72 mg/dL (ref 0.61–1.24)
GFR calc Af Amer: >60 mL/min (ref >60)
GFR calc non Af Amer: >60 mL/min (ref >60)
Glucose, Bld: 104 mg/dL — ABNORMAL HIGH (ref 70–99)
Potassium: 4.3 mmol/L (ref 3.5–5.1)
SODIUM: 138 mmol/L (ref 135–145)
Total Bilirubin: 0.8 mg/dL (ref 0.3–1.2)
Total Protein: 7.9 g/dL (ref 6.5–8.1)

## 2018-12-08 LAB — CBC WITH DIFFERENTIAL/PLATELET
Abs Immature Granulocytes: 0.01 10*3/uL (ref 0.00–0.07)
BASOS ABS: 0 10*3/uL (ref 0.0–0.1)
Basophils Relative: 0 %
Eosinophils Absolute: 0 10*3/uL (ref 0.0–0.5)
Eosinophils Relative: 1 %
HCT: 46.7 % (ref 39.0–52.0)
Hemoglobin: 15.3 g/dL (ref 13.0–17.0)
Immature Granulocytes: 0 %
LYMPHS PCT: 23 %
Lymphs Abs: 1.3 10*3/uL (ref 0.7–4.0)
MCH: 28.7 pg (ref 26.0–34.0)
MCHC: 32.8 g/dL (ref 30.0–36.0)
MCV: 87.5 fL (ref 80.0–100.0)
Monocytes Absolute: 0.5 10*3/uL (ref 0.1–1.0)
Monocytes Relative: 8 %
Neutro Abs: 3.9 10*3/uL (ref 1.7–7.7)
Neutrophils Relative %: 68 %
Platelets: 220 10*3/uL (ref 150–400)
RBC: 5.34 MIL/uL (ref 4.22–5.81)
RDW: 12.5 % (ref 11.5–15.5)
WBC: 5.7 10*3/uL (ref 4.0–10.5)
nRBC: 0 % (ref 0.0–0.2)

## 2018-12-08 MED ORDER — LEVETIRACETAM 500 MG PO TABS: 500.0000 mg | ORAL_TABLET | Freq: Two times a day (BID) | ORAL | 0 refills | Status: DC

## 2018-12-08 MED ORDER — LEVETIRACETAM IN NACL 1000 MG/100ML IV SOLN
1000.0000 mg | Freq: Once | INTRAVENOUS | Status: AC
  Administered 2018-12-08: 1000 mg via INTRAVENOUS
  Filled 2018-12-08: qty 100
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
## 2018-12-08 NOTE — ED Notes (Signed)
Medication administration record requested from nurse at facility at this time. Nurse to fax to ED. The nurse reports pt had refused his medication on 2/23 & 12/08/2018 but had also had some bad behaviors recently per the guards. Nurse reports pt has been on Keppra 1000 mg since his arrival at their facility 02/2018.

## 2018-12-08 NOTE — Discharge Instructions (Signed)
You may take Tylenol or ibuprofen as needed for headache and posterior neck pain.  Take Keppra as prescribed.  Make an appointment to follow-up with neurology.

## 2018-12-08 NOTE — ED Triage Notes (Addendum)
PT from [facility] in [facility] and for the past 2 days has been refusing to take his Keppra 500mg  because it makes him feel slow to process his thoughts and mood changes. PT had a witnessed 5-6 minute seizure and was incontinent of urine. EMS stated pt was postictal at time of arrival. PT alert and oriented on arrival to ED today.  PT states he was working out today and went to stand up and then when he woke up staff was standing around him. PT states has some pain to his upper back from falling to floor.

## 2019-01-18 ENCOUNTER — Emergency Department (HOSPITAL_COMMUNITY)
Admission: EM | Admit: 2019-01-18 | Discharge: 2019-01-18 | Disposition: A | Discharge status: Home / Self Care | Attending: Emergency Medicine | Admitting: Emergency Medicine

## 2019-01-18 ENCOUNTER — Other Ambulatory Visit: Payer: Self-pay

## 2019-01-18 ENCOUNTER — Emergency Department (HOSPITAL_COMMUNITY)

## 2019-01-18 ENCOUNTER — Encounter (HOSPITAL_COMMUNITY): Payer: Self-pay | Admitting: *Deleted

## 2019-01-18 DIAGNOSIS — K625 Hemorrhage of anus and rectum: Secondary | ICD-10-CM | POA: Insufficient documentation

## 2019-01-18 DIAGNOSIS — Z79899 Other long term (current) drug therapy: Secondary | ICD-10-CM | POA: Diagnosis not present

## 2019-01-18 DIAGNOSIS — R1084 Generalized abdominal pain: Secondary | ICD-10-CM | POA: Insufficient documentation

## 2019-01-18 DIAGNOSIS — J45909 Unspecified asthma, uncomplicated: Secondary | ICD-10-CM | POA: Diagnosis not present

## 2019-01-18 DIAGNOSIS — K029 Dental caries, unspecified: Secondary | ICD-10-CM | POA: Insufficient documentation

## 2019-01-18 DIAGNOSIS — I10 Essential (primary) hypertension: Secondary | ICD-10-CM | POA: Diagnosis not present

## 2019-01-18 DIAGNOSIS — Z87891 Personal history of nicotine dependence: Secondary | ICD-10-CM | POA: Insufficient documentation

## 2019-01-18 DIAGNOSIS — Z880 Allergy status to penicillin: Secondary | ICD-10-CM | POA: Insufficient documentation

## 2019-01-18 LAB — CBC WITH DIFFERENTIAL/PLATELET
Abs Immature Granulocytes: 0.01 10*3/uL (ref 0.00–0.07)
Basophils Absolute: 0 10*3/uL (ref 0.0–0.1)
Basophils Relative: 0 %
Eosinophils Absolute: 0 10*3/uL (ref 0.0–0.5)
Eosinophils Relative: 1 %
HCT: 43.8 % (ref 39.0–52.0)
Hemoglobin: 14.7 g/dL (ref 13.0–17.0)
Immature Granulocytes: 0 %
Lymphocytes Relative: 32 %
Lymphs Abs: 1.9 10*3/uL (ref 0.7–4.0)
MCH: 29.3 pg (ref 26.0–34.0)
MCHC: 33.6 g/dL (ref 30.0–36.0)
MCV: 87.3 fL (ref 80.0–100.0)
Monocytes Absolute: 0.5 10*3/uL (ref 0.1–1.0)
Monocytes Relative: 8 %
Neutro Abs: 3.5 10*3/uL (ref 1.7–7.7)
Neutrophils Relative %: 59 %
Platelets: 221 10*3/uL (ref 150–400)
RBC: 5.02 MIL/uL (ref 4.22–5.81)
RDW: 12.5 % (ref 11.5–15.5)
WBC: 5.9 10*3/uL (ref 4.0–10.5)
nRBC: 0 % (ref 0.0–0.2)

## 2019-01-18 LAB — COMPREHENSIVE METABOLIC PANEL
ALT: 39 U/L (ref 0–44)
AST: 23 U/L (ref 15–41)
Albumin: 4.4 g/dL (ref 3.5–5.0)
Alkaline Phosphatase: 91 U/L (ref 38–126)
Anion gap: 7 (ref 5–15)
BUN: 12 mg/dL (ref 6–20)
CO2: 29 mmol/L (ref 22–32)
Calcium: 9.3 mg/dL (ref 8.9–10.3)
Chloride: 106 mmol/L (ref 98–111)
Creatinine, Ser: 0.76 mg/dL (ref 0.61–1.24)
GFR calc Af Amer: >60 mL/min (ref >60)
GFR calc non Af Amer: >60 mL/min (ref >60)
Glucose, Bld: 106 mg/dL — ABNORMAL HIGH (ref 70–99)
Potassium: 4 mmol/L (ref 3.5–5.1)
Sodium: 142 mmol/L (ref 135–145)
Total Bilirubin: 0.8 mg/dL (ref 0.3–1.2)
Total Protein: 7.8 g/dL (ref 6.5–8.1)

## 2019-01-18 LAB — URINALYSIS, ROUTINE W REFLEX MICROSCOPIC
Bilirubin Urine: NEGATIVE
Glucose, UA: NEGATIVE mg/dL
Hgb urine dipstick: NEGATIVE
Ketones, ur: NEGATIVE mg/dL
Leukocytes,Ua: NEGATIVE
Nitrite: NEGATIVE
Protein, ur: NEGATIVE mg/dL
Specific Gravity, Urine: >1.046 — ABNORMAL HIGH (ref 1.005–1.030)
pH: 6 (ref 5.0–8.0)

## 2019-01-18 LAB — POC OCCULT BLOOD, ED: Fecal Occult Bld: POSITIVE — AB

## 2019-01-18 LAB — SAMPLE TO BLOOD BANK

## 2019-01-18 LAB — LIPASE, BLOOD: Lipase: 42 U/L (ref 11–51)

## 2019-01-18 MED ORDER — CLINDAMYCIN HCL 150 MG PO CAPS: ORAL_CAPSULE | ORAL | 0 refills | Status: DC

## 2019-01-18 MED ORDER — MORPHINE SULFATE (PF) 4 MG/ML IV SOLN
4.0000 mg | INTRAVENOUS | Status: DC | PRN
  Administered 2019-01-18: 4 mg via INTRAVENOUS
  Filled 2019-01-18: qty 1

## 2019-01-18 MED ORDER — ONDANSETRON HCL 4 MG/2ML IJ SOLN
4.0000 mg | INTRAMUSCULAR | Status: DC | PRN
  Administered 2019-01-18: 4 mg via INTRAVENOUS
  Filled 2019-01-18: qty 2

## 2019-01-18 MED ORDER — IOHEXOL 300 MG/ML  SOLN
100.0000 mL | Freq: Once | INTRAMUSCULAR | Status: AC | PRN
  Administered 2019-01-18: 100 mL via INTRAVENOUS

## 2019-01-18 MED ORDER — FAMOTIDINE 20 MG PO TABS: 20.0000 mg | ORAL_TABLET | Freq: Every day | ORAL | 0 refills | Status: DC

## 2019-01-18 NOTE — ED Provider Notes (Signed)
[facility] EMERGENCY DEPARTMENT Provider Note   CSN: [id number] Arrival date & time: 01/18/19  1433    History   Chief Complaint Chief Complaint  Patient presents with   Abdominal Pain   GI Bleeding    HPI Spencer Davis is a 28 y.o. male.     HPI Pt was seen at 1445.  Per pt, c/o gradual onset and persistence of constant generalized abd "pain" and rectal bleeding for the past 2 weeks.  Describes the abd pain as "cramping."  Describes the rectal bleeding as persistent with BM's for the past 2 weeks, seeing blood on his stools and on the toilet paper when wiping after a BM. [facility] the [facility] medical staff gave him milk of magnesia without improvement of his symptoms.  Denies rectal pain, no rectal discharge, no rectal injury, no N/V, no fevers, no back pain, no rash, no CP/SOB, no black or blood in stools.     Pt also c/o gradual onset and persistence of constant acute flair of his chronic left upper tooth "pain" for the past several days. States his tooth pain is "making me get a migraine."  Denies fevers, no intra-oral edema, no rash, no facial swelling, no dysphagia, no neck pain. States [facility] medical staff at the jail just started him on an antibiotic. The patient has a significant history of similar symptoms previously, recently being evaluated for this complaint and multiple prior evals for same.       Past Medical History:  Diagnosis Date   Asthma    Head trauma    Hypertension    Seizure (HCC)     There are no active problems to display for this patient.   History reviewed. No pertinent surgical history.      Home Medications    Prior to Admission medications   Medication Sig Start Date End Date Taking? Authorizing Provider  cephALEXin (KEFLEX) 500 MG capsule Take 1 capsule (500 mg total) by mouth 2 (two) times daily. 02/10/18   Hedges, Tinnie Gens, PA-C  dicyclomine (BENTYL) 20 MG tablet Take 1 tablet (20 mg total) by mouth 2 (two) times daily. 11/23/17   Cathie Hoops, Amy  V, PA-C  levETIRAcetam (KEPPRA) 500 MG tablet Take 1 tablet (500 mg total) by mouth 2 (two) times daily. 12/08/18   Loren Racer, MD  ondansetron (ZOFRAN ODT) 4 MG disintegrating tablet Take 1 tablet (4 mg total) by mouth every 8 (eight) hours as needed for nausea or vomiting. 11/23/17   Cathie Hoops, Amy V, PA-C  traMADol (ULTRAM) 50 MG tablet Take 1 tablet (50 mg total) by mouth every 6 (six) hours as needed. 02/10/18   Eyvonne Mechanic, PA-C    Family History History reviewed. No pertinent family history.  Social History Social History   Tobacco Use   Smoking status: Former Smoker    Packs/day: 0.25    Types: Cigarettes   Smokeless tobacco: Never Used  Substance Use Topics   Alcohol use: Not Currently    Comment: "rarely"   Drug use: Not Currently     Allergies   Amoxicillin and Penicillins   Review of Systems Review of Systems ROS: Statement: All systems negative except as marked or noted in the HPI; Constitutional: Negative for fever and chills. ; ; Eyes: Negative for eye pain and discharge. ; ; ENMT: Positive for dental caries, dental hygiene poor and toothache. Negative for ear pain, bleeding gums, dental injury, facial deformity, facial swelling, hoarseness, nasal congestion, sinus pressure, sore throat, throat swelling and tongue  swollen. ; ;  Cardiovascular: Negative for chest pain, palpitations, diaphoresis, dyspnea and peripheral edema. ; ; Respiratory: Negative for cough, wheezing and stridor. ; ; Gastrointestinal: Negative for nausea, vomiting, diarrhea, hematemesis, jaundice and +abd pain, rectal bleeding. . ; ; Genitourinary: Negative for dysuria, flank pain and hematuria. ; ; Musculoskeletal: Negative for back pain and neck pain. Negative for swelling and trauma.; ; Skin: Negative for pruritus, rash, abrasions, blisters, bruising and skin lesion.; ; Neuro: Negative for headache, lightheadedness and neck stiffness. Negative for weakness, altered level of consciousness, altered  mental status, extremity weakness, paresthesias, involuntary movement, seizure and syncope.       Physical Exam Updated Vital Signs BP 129/72 (BP Location: Left Arm)    Pulse 76    Temp 98.3 F (36.8 C) (Oral)    Resp 18    Ht 5\' 6"  (1.676 m)    Wt 129.7 kg    SpO2 100%    BMI 46.16 kg/m     Physical Exam 1450: Physical examination: Vital signs and O2 SAT: Reviewed; Constitutional: Well developed, Well nourished, Well hydrated, In no acute distress; Head and Face: Normocephalic, Atraumatic; Eyes: EOMI, PERRL, No scleral icterus; ENMT: Mouth and pharynx normal, Poor dentition, Widespread dental decay, Mucous membranes moist, +upper left 2nd premolar with dental decay. Multiple missing teeth. No gingival erythema, edema, fluctuance, or drainage.  No intra-oral edema. No submandibular or sublingual edema. No hoarse voice, no drooling, no stridor. No trismus. ; Neck: Supple, Full range of motion, No lymphadenopathy; Cardiovascular: Regular rate and rhythm, No gallop; Respiratory: Breath sounds clear & equal bilaterally, No wheezes.  Speaking full sentences with ease, Normal respiratory effort/excursion; Chest: Nontender, Movement normal; Abdomen: Soft, +generalized abd tenderness to palp. No rebound or guarding. Nondistended, Normal bowel sounds. Rectal exam performed w/permission of pt and ED RN chaperone present.  Anal tone normal.  Non-tender, clear secretions without stool in rectal vault, heme positive.  No fissures, +external hemorrhoids without thrombosis or obvious bleeding. No palp masses.; Genitourinary: No CVA tenderness; Extremities: Peripheral pulses normal, No tenderness, No edema, No calf edema or asymmetry.; Neuro: AA&Ox3, Major CN grossly intact.  Speech clear. No gross focal motor or sensory deficits in extremities. Climbs on and off stretcher easily by himself. Gait steady.; Skin: Color normal, Warm, Dry.    ED Treatments / Results  Labs (all labs ordered are listed, but only  abnormal results are displayed)   EKG None  Radiology   Procedures Procedures (including critical care time)  Medications Ordered in ED Medications  morphine 4 MG/ML injection 4 mg (has no administration in time range)  ondansetron (ZOFRAN) injection 4 mg (has no administration in time range)     Initial Impression / Assessment and Plan / ED Course  I have reviewed the triage vital signs and the nursing notes.  Pertinent labs & imaging results that were available during my care of the patient were reviewed by me and considered in my medical decision making (see chart for details).     MDM Reviewed: previous chart, nursing note and vitals Reviewed previous: labs Interpretation: labs, x-ray and CT scan    Results for orders placed or performed during the hospital encounter of 01/18/19  Comprehensive metabolic panel  Result Value Ref Range   Sodium 142 135 - 145 mmol/L   Potassium 4.0 3.5 - 5.1 mmol/L   Chloride 106 98 - 111 mmol/L   CO2 29 22 - 32 mmol/L   Glucose, Bld 106 (H) 70 - 99  mg/dL   BUN 12 6 - 20 mg/dL   Creatinine, Ser 1.61 0.61 - 1.24 mg/dL   Calcium 9.3 8.9 - 09.6 mg/dL   Total Protein 7.8 6.5 - 8.1 g/dL   Albumin 4.4 3.5 - 5.0 g/dL   AST 23 15 - 41 U/L   ALT 39 0 - 44 U/L   Alkaline Phosphatase 91 38 - 126 U/L   Total Bilirubin 0.8 0.3 - 1.2 mg/dL   GFR calc non Af Amer >60 >60 mL/min   GFR calc Af Amer >60 >60 mL/min   Anion gap 7 5 - 15  Lipase, blood  Result Value Ref Range   Lipase 42 11 - 51 U/L  CBC with Differential  Result Value Ref Range   WBC 5.9 4.0 - 10.5 K/uL   RBC 5.02 4.22 - 5.81 MIL/uL   Hemoglobin 14.7 13.0 - 17.0 g/dL   HCT 04.5 40.9 - 81.1 %   MCV 87.3 80.0 - 100.0 fL   MCH 29.3 26.0 - 34.0 pg   MCHC 33.6 30.0 - 36.0 g/dL   RDW 91.4 78.2 - 95.6 %   Platelets 221 150 - 400 K/uL   nRBC 0.0 0.0 - 0.2 %   Neutrophils Relative % 59 %   Neutro Abs 3.5 1.7 - 7.7 K/uL   Lymphocytes Relative 32 %   Lymphs Abs 1.9 0.7 - 4.0  K/uL   Monocytes Relative 8 %   Monocytes Absolute 0.5 0.1 - 1.0 K/uL   Eosinophils Relative 1 %   Eosinophils Absolute 0.0 0.0 - 0.5 K/uL   Basophils Relative 0 %   Basophils Absolute 0.0 0.0 - 0.1 K/uL   Immature Granulocytes 0 %   Abs Immature Granulocytes 0.01 0.00 - 0.07 K/uL  Urinalysis, Routine w reflex microscopic  Result Value Ref Range   Color, Urine YELLOW YELLOW   APPearance CLEAR CLEAR   Specific Gravity, Urine >1.046 (H) 1.005 - 1.030   pH 6.0 5.0 - 8.0   Glucose, UA NEGATIVE NEGATIVE mg/dL   Hgb urine dipstick NEGATIVE NEGATIVE   Bilirubin Urine NEGATIVE NEGATIVE   Ketones, ur NEGATIVE NEGATIVE mg/dL   Protein, ur NEGATIVE NEGATIVE mg/dL   Nitrite NEGATIVE NEGATIVE   Leukocytes,Ua NEGATIVE NEGATIVE  POC occult blood, ED  Result Value Ref Range   Fecal Occult Bld POSITIVE (A) NEGATIVE  Sample to Blood Bank  Result Value Ref Range   Blood Bank Specimen SAMPLE AVAILABLE FOR TESTING    Sample Expiration      01/21/2019 Performed at [facility] [address] [place], [place] [address]    Dg Chest 2 View Result Date: 01/18/2019 CLINICAL DATA:  28 year old male with dental pain EXAM: CHEST - 2 VIEW COMPARISON:  02/23/2018 FINDINGS: Cardiomediastinal silhouette unchanged in size and contour. No pneumothorax. No pleural effusion. No confluent airspace disease. Similar appearance of coarsened interstitial markings. No displaced fracture. IMPRESSION: Negative for acute cardiopulmonary disease Electronically Signed   By: Gilmer Mor D.O.   On: 01/18/2019 16:08   Ct Abdomen Pelvis W Contrast Result Date: 01/18/2019 CLINICAL DATA:  Patient on antibiotics for dental pain past several days. Complains of lower abdominal pain ongoing for 2 weeks. Also states bright red blood per rectum over the past 2 weeks. EXAM: CT ABDOMEN AND PELVIS WITH CONTRAST TECHNIQUE: Multidetector CT imaging of the abdomen and pelvis was performed using the standard protocol following bolus  administration of intravenous contrast. CONTRAST:  OMNIPAQUE IOHEXOL 300 MG/ML  SOLN COMPARISON:  None.  FINDINGS: Lower chest: Lung bases are normal. Hepatobiliary: Liver, gallbladder and biliary tree are normal. Pancreas: Normal. Spleen: Normal. Adrenals/Urinary Tract: Adrenal glands are normal. Kidneys are normal in size without hydronephrosis or nephrolithiasis. Ureters are normal in caliber without evidence of stones. Bladder is normal. Stomach/Bowel: Stomach and small bowel are normal. Appendix is normal. Colon is normal. Vascular/Lymphatic: Normal. Reproductive: Normal. Other: No free fluid or focal inflammatory change. Musculoskeletal: Unremarkable. IMPRESSION: No acute findings in the abdomen/pelvis. Electronically Signed   By: Elberta Fortis M.D.   On: 01/18/2019 16:08    1650:  Pt states he has had rectal bleeding for the past 2 weeks; H/H is per baseline. Workup reassuring. No clear indication for admission at this time. Tx symptomatically, f/u GI MD and DDS. Dx and testing d/w pt. Questions answered.  Verb understanding, [facility]able [facility].     Final Clinical Impressions(s) / ED Diagnoses   Final diagnoses:  None    ED Discharge Orders    None       Samuel Jester, DO 01/22/19 [phone]

## 2019-01-18 NOTE — Discharge Instructions (Addendum)
Take the prescriptions as directed.  Increase your fluid intake for the next few days. Use over the counter hemorrhoidal relief products (ie:  preparation H, anusol), as directed on packaging, as needed for symptoms.  Call your regular medical doctor tomorrow to schedule a follow up appointment within the next 3 days. Call your dentist tomorrow to schedule a follow up appointment within the next week. Call the GI doctor tomorrow to schedule a follow up appointment within the next week.  Return to the Emergency Department immediately if worsening.

## 2019-01-18 NOTE — ED Notes (Signed)
Dr Mike Gip request update of meds  Per pt he continues to take tramadol except "now I'm incarcerated"- He reports this med is prescribed by Urgent care

## 2019-01-18 NOTE — ED Triage Notes (Signed)
Pt on antibiotics for dental pain for past several days. Lower abd pain ongoing for 2 weeks, pt noted bright red blood as well as long as 2 weeks, denies any known hemorrhoids.

## 2020-02-20 ENCOUNTER — Encounter (HOSPITAL_COMMUNITY): Payer: Self-pay | Admitting: Emergency Medicine

## 2020-02-20 ENCOUNTER — Emergency Department (HOSPITAL_COMMUNITY): Payer: Self-pay

## 2020-02-20 ENCOUNTER — Other Ambulatory Visit: Payer: Self-pay

## 2020-02-20 ENCOUNTER — Emergency Department (HOSPITAL_COMMUNITY)
Admission: EM | Admit: 2020-02-20 | Discharge: 2020-02-20 | Disposition: A | Discharge status: Home / Self Care | Payer: Self-pay | Attending: Emergency Medicine | Admitting: Emergency Medicine

## 2020-02-20 DIAGNOSIS — R002 Palpitations: Secondary | ICD-10-CM | POA: Insufficient documentation

## 2020-02-20 DIAGNOSIS — R0602 Shortness of breath: Secondary | ICD-10-CM | POA: Insufficient documentation

## 2020-02-20 DIAGNOSIS — F419 Anxiety disorder, unspecified: Secondary | ICD-10-CM | POA: Insufficient documentation

## 2020-02-20 DIAGNOSIS — R638 Other symptoms and signs concerning food and fluid intake: Secondary | ICD-10-CM | POA: Insufficient documentation

## 2020-02-20 DIAGNOSIS — R079 Chest pain, unspecified: Secondary | ICD-10-CM

## 2020-02-20 DIAGNOSIS — Z79899 Other long term (current) drug therapy: Secondary | ICD-10-CM | POA: Insufficient documentation

## 2020-02-20 DIAGNOSIS — J45909 Unspecified asthma, uncomplicated: Secondary | ICD-10-CM | POA: Insufficient documentation

## 2020-02-20 DIAGNOSIS — R0789 Other chest pain: Secondary | ICD-10-CM | POA: Insufficient documentation

## 2020-02-20 DIAGNOSIS — Z87891 Personal history of nicotine dependence: Secondary | ICD-10-CM | POA: Insufficient documentation

## 2020-02-20 DIAGNOSIS — I10 Essential (primary) hypertension: Secondary | ICD-10-CM | POA: Insufficient documentation

## 2020-02-20 DIAGNOSIS — Z72820 Sleep deprivation: Secondary | ICD-10-CM | POA: Insufficient documentation

## 2020-02-20 DIAGNOSIS — Z91010 Allergy to peanuts: Secondary | ICD-10-CM | POA: Insufficient documentation

## 2020-02-20 LAB — BASIC METABOLIC PANEL
Anion gap: 9 (ref 5–15)
BUN: 12 mg/dL (ref 6–20)
CO2: 27 mmol/L (ref 22–32)
Calcium: 9 mg/dL (ref 8.9–10.3)
Chloride: 105 mmol/L (ref 98–111)
Creatinine, Ser: 0.87 mg/dL (ref 0.61–1.24)
GFR calc Af Amer: >60 mL/min (ref >60)
GFR calc non Af Amer: >60 mL/min (ref >60)
Glucose, Bld: 135 mg/dL — ABNORMAL HIGH (ref 70–99)
Potassium: 4.2 mmol/L (ref 3.5–5.1)
Sodium: 141 mmol/L (ref 135–145)

## 2020-02-20 LAB — CBC
HCT: 44.5 % (ref 39.0–52.0)
Hemoglobin: 14.8 g/dL (ref 13.0–17.0)
MCH: 29.5 pg (ref 26.0–34.0)
MCHC: 33.3 g/dL (ref 30.0–36.0)
MCV: 88.6 fL (ref 80.0–100.0)
Platelets: 237 10*3/uL (ref 150–400)
RBC: 5.02 MIL/uL (ref 4.22–5.81)
RDW: 13 % (ref 11.5–15.5)
WBC: 7.5 10*3/uL (ref 4.0–10.5)
nRBC: 0 % (ref 0.0–0.2)

## 2020-02-20 LAB — TROPONIN I (HIGH SENSITIVITY)
Troponin I (High Sensitivity): 3 ng/L (ref <18)
Troponin I (High Sensitivity): 4 ng/L (ref <18)

## 2020-02-20 MED ORDER — SODIUM CHLORIDE 0.9 % IV BOLUS: 1000.0000 mL | Freq: Once | INTRAVENOUS | Status: DC

## 2020-02-20 MED ORDER — SODIUM CHLORIDE 0.9% FLUSH
3.0000 mL | Freq: Once | INTRAVENOUS | Status: AC
  Administered 2020-02-20: 3 mL via INTRAVENOUS

## 2020-02-20 NOTE — ED Provider Notes (Signed)
[facility]-EMERGENCY DEPT Provider Note   CSN: [id number] Arrival date & time: 02/20/20  0204     History Chief Complaint  Patient presents with  . Chest Pain    Spencer Davis is a 29 y.o. male with past medical history significant for HTN, GERD, and anxiety presents to the ED via EMS with complaints of mid chest pain that woke him from sleep.  Patient reports that approximately 3 months ago he was diagnosed with panic disorder and placed on BuSpar.  He believes that the BuSpar is contributing to his chest pain with associated shortness of breath symptoms.  Patient states that he endorses episodes approximately 3 times per week and they often will last anywhere from 15 minutes to 1 hour duration.  His current episode of chest pain began approximately 2 AM and woke him from sleep.  He describes them as central chest pain that radiates to the left side of his chest.  They began as a "flutter" sensation.  No obvious aggravating or alleviating factors.  Patient is currently residing in a halfway home.  He denies any associated nausea or vomiting, diaphoresis, headache or dizziness, recent illness, fevers or chills, abdominal pain, exertional component, pleuritic component, or other symptoms.  Patient received both of his COVID-19 vaccinations.    HPI     Past Medical History:  Diagnosis Date  . Asthma   . Head trauma   . Hypertension   . Seizure (HCC)     There are no problems to display for this patient.   History reviewed. No pertinent surgical history.     No family history on file.  Social History   Tobacco Use  . Smoking status: Former Smoker    Packs/day: 0.25    Types: Cigarettes  . Smokeless tobacco: Never Used  Substance Use Topics  . Alcohol use: Not Currently    Comment: "rarely"  . Drug use: Not Currently    Home Medications Prior to Admission medications   Medication Sig Start Date End Date Taking? Authorizing Provider  busPIRone  (BUSPAR) 15 MG tablet Take 7.5-15 mg by mouth daily as needed (anxiety).   Yes [provider]  levETIRAcetam (KEPPRA) 500 MG tablet Take 1 tablet (500 mg total) by mouth 2 (two) times daily. Patient taking differently: Take 1,000 mg by mouth 2 (two) times daily.  12/08/18  Yes Loren Racer, MD  lisinopril (ZESTRIL) 10 MG tablet Take 10 mg by mouth daily.   Yes [provider]  prednisoLONE acetate (PRED FORTE) 1 % ophthalmic suspension Place 2 drops into both eyes daily.   Yes [provider]  dicyclomine (BENTYL) 20 MG tablet Take 1 tablet (20 mg total) by mouth 2 (two) times daily. Patient not taking: Reported on 02/20/2020 11/23/17   Belinda Fisher, PA-C  famotidine (PEPCID) 20 MG tablet Take 1 tablet (20 mg total) by mouth daily. Patient not taking: Reported on 02/20/2020 01/18/19   Samuel Jester, DO  ondansetron (ZOFRAN ODT) 4 MG disintegrating tablet Take 1 tablet (4 mg total) by mouth every 8 (eight) hours as needed for nausea or vomiting. Patient not taking: Reported on 02/20/2020 11/23/17   Belinda Fisher, PA-C  traMADol (ULTRAM) 50 MG tablet Take 1 tablet (50 mg total) by mouth every 6 (six) hours as needed. Patient not taking: Reported on 02/20/2020 02/10/18   Eyvonne Mechanic, PA-C    Allergies    Amoxicillin, Peanut-containing drug products, and Penicillins  Review of Systems   Review  of Systems  Constitutional: Negative for diaphoresis and fever.  Respiratory: Negative for cough and wheezing.   Cardiovascular: Positive for chest pain and palpitations. Negative for leg swelling.  Gastrointestinal: Negative for abdominal pain and nausea.    Physical Exam Updated Vital Signs BP (!) 148/98   Pulse 88   Temp 98.3 F (36.8 C) (Oral)   Resp 18   Ht 5\' 8"  (1.727 m)   Wt 117.9 kg   SpO2 100%   BMI 39.53 kg/m   Physical Exam Vitals and nursing note reviewed. Exam conducted with a chaperone present.  Constitutional:      General: He is not in acute distress.     Appearance: Normal appearance. He is not ill-appearing.  HENT:     Head: Normocephalic and atraumatic.  Eyes:     General: No scleral icterus.    Conjunctiva/sclera: Conjunctivae normal.  Cardiovascular:     Rate and Rhythm: Normal rate and regular rhythm.     Pulses: Normal pulses.     Heart sounds: Normal heart sounds.  Pulmonary:     Effort: Pulmonary effort is normal. No respiratory distress.     Breath sounds: Normal breath sounds. No wheezing or rales.  Abdominal:     General: Abdomen is flat. There is no distension.     Palpations: Abdomen is soft.     Tenderness: There is no abdominal tenderness. There is no guarding.  Musculoskeletal:     Cervical back: Normal range of motion. No rigidity.  Skin:    General: Skin is dry.  Neurological:     Mental Status: He is alert.     GCS: GCS eye subscore is 4. GCS verbal subscore is 5. GCS motor subscore is 6.  Psychiatric:        Mood and Affect: Mood normal.        Behavior: Behavior normal.        Thought Content: Thought content normal.     ED Results / Procedures / Treatments   Labs (all labs ordered are listed, but only abnormal results are displayed) Labs Reviewed  BASIC METABOLIC PANEL - Abnormal; Notable for the following components:      Result Value   Glucose, Bld 135 (*)    All other components within normal limits  CBC  TROPONIN I (HIGH SENSITIVITY)  TROPONIN I (HIGH SENSITIVITY)    EKG None  Radiology DG Chest Portable 1 View  Result Date: 02/20/2020 CLINICAL DATA:  Chest pain. Additional history provided: Patient reports mid chest pain, history of asthma, anxiety, hypertension, former smoker. EXAM: PORTABLE CHEST 1 VIEW COMPARISON:  Prior chest radiographs 01/18/2019 and earlier FINDINGS: Heart size within normal limits. There is no appreciable airspace consolidation. No evidence of pleural effusion or pneumothorax. No acute bony abnormality identified. IMPRESSION: No evidence of acute cardiopulmonary  abnormality. Electronically Signed   By: Kellie Simmering DO   On: 02/20/2020 11:23    Procedures Procedures (including critical care time)  Medications Ordered in ED Medications  sodium chloride flush (NS) 0.9 % injection 3 mL (3 mLs Intravenous Given 02/20/20 9211)    ED Course  I have reviewed the triage vital signs and the nursing notes.  Pertinent labs & imaging results that were available during my care of the patient were reviewed by me and considered in my medical decision making (see chart for details).    MDM Rules/Calculators/A&P  On subsequent evaluation, patient informs me that he is partaking in Ramadan and has not been eating or drinking regularly.  I believe that his dehydration is contributing to his elevated heart rate.  Patient is tolerating food and liquid by mouth without issue, will replenish with p.o. fluids here in the ED.  His laboratory work-up is entirely reassuring.  Obtained troponin x2 given acute onset of his chest discomfort and both values were WNL.  BMP demonstrates no electrolyte derangement that could be contributing to his reported palpitations.  CBC demonstrates no anemia or elevated white count concerning for infection.  Do not feel as though TSH is warranted at this time.  Plain films obtained are personally reviewed and demonstrate no acute cardiopulmonary findings.  Patient is PERC negative and he states that his chest discomfort has entirely improved.  He is no longer feeling short of breath.  Do not feel as though any advanced imaging or further laboratory work-up is warranted.    Encouraging patient follow-up with his primary care provider.  Patient may benefit from Holter monitor given his reported palpitations in the past.  He denies any significant family history of premature cardiac death and no history of exertional chest pain or exertional syncope.  Patient is resting comfortably and in no acute distress.  He is hemodynamically  stable and feels prepared for discharge.  He suspects that it is related to not getting enough sleep and not eating/drinking well during Ramadan.  Strict ED return precautions discussed.  All of the evaluation and work-up results were discussed with the patient and any family at bedside. They were provided opportunity to ask any additional questions and have none at this time. They have expressed understanding of verbal discharge instructions as well as return precautions and are agreeable to the plan.    Final Clinical Impression(s) / ED Diagnoses Final diagnoses:  Nonspecific chest pain    Rx / DC Orders ED Discharge Orders    None       Lorelee New, PA-C 02/20/20 1156    Long, Arlyss Repress, MD 02/21/20 [phone]

## 2020-02-20 NOTE — Discharge Instructions (Addendum)
I suspect that your chest discomfort and palpitations are related to the fact that you have not been eating and drinking well during this period of Ramadan.  I also encourage you to get plenty of sleep and to avoid using excessive screen time late at night.  Given your concern that this is related to your BuSpar, I encourage you to follow-up with your prescribing provider regarding that medication as they may be able to substitute it with something that is better tolerated.  Please return to the ED or seek immediate medical attention should you experience any new or worsening symptoms.

## 2020-02-20 NOTE — ED Triage Notes (Signed)
Patient presents via EMS with mid chest pain. Patient has hx of anxiety and states that last time this happened, it was his anxiety. Patient states pain woke him up. Patient ambulatory in triage.

## 2020-02-24 ENCOUNTER — Encounter (HOSPITAL_COMMUNITY): Payer: Self-pay

## 2020-02-24 ENCOUNTER — Emergency Department (HOSPITAL_COMMUNITY)
Admission: EM | Admit: 2020-02-24 | Discharge: 2020-02-24 | Disposition: A | Discharge status: Home / Self Care | Payer: Self-pay | Attending: Emergency Medicine | Admitting: Emergency Medicine

## 2020-02-24 DIAGNOSIS — Z87891 Personal history of nicotine dependence: Secondary | ICD-10-CM | POA: Insufficient documentation

## 2020-02-24 DIAGNOSIS — Z79899 Other long term (current) drug therapy: Secondary | ICD-10-CM | POA: Insufficient documentation

## 2020-02-24 DIAGNOSIS — F410 Panic disorder [episodic paroxysmal anxiety] without agoraphobia: Secondary | ICD-10-CM | POA: Insufficient documentation

## 2020-02-24 DIAGNOSIS — J45909 Unspecified asthma, uncomplicated: Secondary | ICD-10-CM | POA: Insufficient documentation

## 2020-02-24 DIAGNOSIS — I10 Essential (primary) hypertension: Secondary | ICD-10-CM | POA: Insufficient documentation

## 2020-02-24 DIAGNOSIS — R0789 Other chest pain: Secondary | ICD-10-CM | POA: Insufficient documentation

## 2020-02-24 MED ORDER — HYDROXYZINE HCL 25 MG PO TABS
50.0000 mg | ORAL_TABLET | Freq: Once | ORAL | Status: AC
  Administered 2020-02-24: 50 mg via ORAL
  Filled 2020-02-24: qty 2

## 2020-02-24 MED ORDER — HYDROXYZINE HCL 25 MG PO TABS: 25.0000 mg | ORAL_TABLET | Freq: Four times a day (QID) | ORAL | 0 refills | Status: DC | PRN

## 2020-02-24 NOTE — Discharge Instructions (Signed)
Steps to find a Primary Care Provider (PCP): ° °Call [phone] or [phone] to access "[facility] Find a Doctor Service." ° °2.  You may also go on the [facility] website at [email or web address] ° °3.  [facility] also frequently accepts new patients. ° °[facility]  °[address] °[place] [address] °[phone] ° °4.  There are also multiple [facility], [facility] and [facility] practices throughout the [place] that are frequently accepting new patients. You may find a clinic that is close to your home and contact them. ° °[facility] °[email or web address] °[phone] ° °[facility] °[email or web address] ° °[facility] °[email or web address] °[phone] ° °[facility] °[email or web address] °[phone] ° °5.  Local Health Departments also can provide primary care services. ° °[facility]  °[address] °[place] [address] °[phone] ° °[facility] °[address] °[place] [address] °[phone] ° °[facility] °[address]  °[place] [address] °[phone] ° ° °

## 2020-02-24 NOTE — ED Triage Notes (Signed)
Pt is from the [facility] house He complains of tingling in his hands, throbbing in his chest

## 2020-02-24 NOTE — ED Provider Notes (Signed)
TIME SEEN: 12:20 AM  CHIEF COMPLAINT: Chest pain, tingling all over, anxiety  HPI: Patient is a 29 year old male who is here for the second time in 48 hours for complaints of chest feeling tight, palpitations, shortness of breath, feeling tingling all over and very anxious.  No SI or HI.  Had full work-up yesterday that was unremarkable.  He has history of hypertension but no diabetes, hyperlipidemia.  States he is no longer a smoker.  No history of PE, DVT, exogenous estrogen use, recent fractures, surgery, trauma, hospitalization or prolonged travel. No lower extremity swelling or pain. No calf tenderness.   ROS: See HPI Constitutional: no fever  Eyes: no drainage  ENT: no runny nose   Cardiovascular:   chest pain  Resp: no SOB  GI: no vomiting GU: no dysuria Integumentary: no rash  Allergy: no hives  Musculoskeletal: no leg swelling  Neurological: no slurred speech ROS otherwise negative  PAST MEDICAL HISTORY/PAST SURGICAL HISTORY:  Past Medical History:  Diagnosis Date  . Asthma   . Head trauma   . Hypertension   . Seizure [facility])     MEDICATIONS:  Prior to Admission medications   Medication Sig Start Date End Date Taking? Authorizing Provider  busPIRone (BUSPAR) 15 MG tablet Take 7.5-15 mg by mouth daily as needed (anxiety).    [provider]  dicyclomine (BENTYL) 20 MG tablet Take 1 tablet (20 mg total) by mouth 2 (two) times daily. Patient not taking: Reported on 02/20/2020 11/23/17   Belinda Fisher, PA-C  famotidine (PEPCID) 20 MG tablet Take 1 tablet (20 mg total) by mouth daily. Patient not taking: Reported on 02/20/2020 01/18/19   Samuel Jester, DO  levETIRAcetam (KEPPRA) 500 MG tablet Take 1 tablet (500 mg total) by mouth 2 (two) times daily. Patient taking differently: Take 1,000 mg by mouth 2 (two) times daily.  12/08/18   Loren Racer, MD  lisinopril (ZESTRIL) 10 MG tablet Take 10 mg by mouth daily.    [provider]  ondansetron (ZOFRAN ODT) 4 MG  disintegrating tablet Take 1 tablet (4 mg total) by mouth every 8 (eight) hours as needed for nausea or vomiting. Patient not taking: Reported on 02/20/2020 11/23/17   Belinda Fisher, PA-C  prednisoLONE acetate (PRED FORTE) 1 % ophthalmic suspension Place 2 drops into both eyes daily.    [provider]  traMADol (ULTRAM) 50 MG tablet Take 1 tablet (50 mg total) by mouth every 6 (six) hours as needed. Patient not taking: Reported on 02/20/2020 02/10/18   Eyvonne Mechanic, PA-C    ALLERGIES:  Allergies  Allergen Reactions  . Amoxicillin Anaphylaxis  . Peanut-Containing Drug Products Itching and Other (See Comments)    Tingly sensation in mouth  . Penicillins Rash    Has patient had a PCN reaction causing immediate rash, facial/tongue/throat swelling, SOB or lightheadedness with hypotension: No Has patient had a PCN reaction causing severe rash involving mucus membranes or skin necrosis: No Has patient had a PCN reaction that required hospitalization No Has patient had a PCN reaction occurring within the last 10 years: Yes If all of the above answers are "NO", then may proceed with Cephalosporin use.    SOCIAL HISTORY:  Social History   Tobacco Use  . Smoking status: Former Smoker    Packs/day: 0.25    Types: Cigarettes  . Smokeless tobacco: Never Used  Substance Use Topics  . Alcohol use: Not Currently    Comment: "rarely"    FAMILY HISTORY: History  reviewed. No pertinent family history.  EXAM: BP (!) 152/86 (BP Location: Right Arm)   Pulse (!) 106   Temp 98.2 F (36.8 C) (Oral)   Resp 18   SpO2 99%  CONSTITUTIONAL: Alert and oriented and responds appropriately to questions. Well-appearing; well-nourished HEAD: Normocephalic EYES: Conjunctivae clear, pupils appear equal, EOM appear intact ENT: normal nose; moist mucous membranes NECK: Supple, normal ROM CARD: Regular and intermittently tachycardic; S1 and S2 appreciated; no murmurs, no clicks, no rubs, no gallops RESP:  Normal chest excursion without splinting or tachypnea; breath sounds clear and equal bilaterally; no wheezes, no rhonchi, no rales, no hypoxia or respiratory distress, speaking full sentences ABD/GI: Normal bowel sounds; non-distended; soft, non-tender, no rebound, no guarding, no peritoneal signs, no hepatosplenomegaly BACK:  The back appears normal EXT: Normal ROM in all joints; no deformity noted, no edema; no cyanosis SKIN: Normal color for age and race; warm; no rash on exposed skin NEURO: Moves all extremities equally PSYCH: The patient's mood and manner are appropriate.  No SI or HI.  MEDICAL DECISION MAKING: Patient here with symptoms that sound like panic attack.  He had full work-up yesterday including labs, chest x-ray which were reassuring.  He had negative troponins.  He has no risk factors for PE.  Doubt ACS, PE, dissection.  Chest x-ray did not show pneumonia or pneumothorax.  His EKG today has been reviewed/interpreted and shows no acute change.  I recommended outpatient follow-up for this.  I do not feel he needs emergent psychiatric treatment.  Will provide with Atarax for symptomatic relief.  After reassurance, patient states he is feeling better.  At this time, I do not feel there is any life-threatening condition present. I have reviewed, interpreted and discussed all results (EKG, imaging, lab, urine as appropriate) and exam findings with patient/family. I have reviewed nursing notes and appropriate previous records.  I feel the patient is safe to be discharged home without further emergent workup and can continue workup as an outpatient as needed. Discussed usual and customary return precautions. Patient/family verbalize understanding and are comfortable with this plan.  Outpatient follow-up has been provided as needed. All questions have been answered.    EKG Interpretation  Date/Time:  Wednesday Feb 24 2020 00:41:14 EDT Ventricular Rate:  100 PR Interval:    QRS  Duration: 112 QT Interval:  338 QTC Calculation: 436 R Axis:   90 Text Interpretation: Sinus tachycardia Borderline intraventricular conduction delay 12 Lead; Mason-Likar No significant change since last tracing Confirmed by Pryor Curia [phone]) on 02/24/2020 12:43:16 AM          Spencer Davis was evaluated in Emergency Department on 02/24/2020 for the symptoms described in the history of present illness. He was evaluated in the context of the global COVID-19 pandemic, which necessitated consideration that the patient might be at risk for infection with the SARS-CoV-2 virus that causes COVID-19. Institutional protocols and algorithms that pertain to the evaluation of patients at risk for COVID-19 are in a state of rapid change based on information released by regulatory bodies including the CDC and federal and state organizations. These policies and algorithms were followed during the patient's care in the ED.      Sumiko Ceasar, Delice Bison, DO 02/24/20 [phone]

## 2020-02-24 NOTE — ED Notes (Signed)
Pt was going to sleep until the buses started but then he found a ride home

## 2020-04-16 ENCOUNTER — Encounter (HOSPITAL_COMMUNITY): Payer: Self-pay | Admitting: Emergency Medicine

## 2020-04-16 ENCOUNTER — Emergency Department (HOSPITAL_COMMUNITY): Payer: Self-pay

## 2020-04-16 ENCOUNTER — Emergency Department (HOSPITAL_COMMUNITY)
Admission: EM | Admit: 2020-04-16 | Discharge: 2020-04-17 | Disposition: A | Discharge status: Home / Self Care | Payer: Self-pay | Attending: Emergency Medicine | Admitting: Emergency Medicine

## 2020-04-16 ENCOUNTER — Other Ambulatory Visit: Payer: Self-pay

## 2020-04-16 DIAGNOSIS — R569 Unspecified convulsions: Secondary | ICD-10-CM

## 2020-04-16 DIAGNOSIS — Z87891 Personal history of nicotine dependence: Secondary | ICD-10-CM | POA: Insufficient documentation

## 2020-04-16 DIAGNOSIS — Z79899 Other long term (current) drug therapy: Secondary | ICD-10-CM | POA: Insufficient documentation

## 2020-04-16 DIAGNOSIS — R519 Headache, unspecified: Secondary | ICD-10-CM | POA: Insufficient documentation

## 2020-04-16 DIAGNOSIS — M542 Cervicalgia: Secondary | ICD-10-CM | POA: Insufficient documentation

## 2020-04-16 DIAGNOSIS — J45909 Unspecified asthma, uncomplicated: Secondary | ICD-10-CM | POA: Insufficient documentation

## 2020-04-16 DIAGNOSIS — M546 Pain in thoracic spine: Secondary | ICD-10-CM | POA: Insufficient documentation

## 2020-04-16 DIAGNOSIS — I10 Essential (primary) hypertension: Secondary | ICD-10-CM | POA: Insufficient documentation

## 2020-04-16 DIAGNOSIS — G40909 Epilepsy, unspecified, not intractable, without status epilepticus: Secondary | ICD-10-CM | POA: Insufficient documentation

## 2020-04-16 LAB — CBG MONITORING, ED: Glucose-Capillary: 153 mg/dL — ABNORMAL HIGH (ref 70–99)

## 2020-04-16 NOTE — ED Provider Notes (Signed)
[facility]-EMERGENCY DEPT Provider Note   CSN: [id number] Arrival date & time: 04/16/20  2134     History Chief Complaint  Patient presents with  . Seizures    Spencer Davis is a 29 y.o. male.  Patient presents to the emergency department with a chief complaint of seizure.  Per EMS, he had a witnessed seizure at home.  He did have a fall.  He states that he was talking with his significant other trying to cope with a miscarriage, and had been under a lot of stress.  He also reports that he has not taken his Keppra in several months.  He states that he infrequently has seizures.  Denies any recent illnesses.  He complains of headache, neck pain, and back pain from his fall.  He denies any numbness, weakness, or tingling.  He denies any other known injuries.  The history is provided by the patient. No language interpreter was used.       Past Medical History:  Diagnosis Date  . Asthma   . Head trauma   . Hypertension   . Seizure (HCC)     There are no problems to display for this patient.   History reviewed. No pertinent surgical history.     History reviewed. No pertinent family history.  Social History   Tobacco Use  . Smoking status: Former Smoker    Packs/day: 0.25    Types: Cigarettes  . Smokeless tobacco: Never Used  Vaping Use  . Vaping Use: Never used  Substance Use Topics  . Alcohol use: Not Currently    Comment: "rarely"  . Drug use: Not Currently    Home Medications Prior to Admission medications   Medication Sig Start Date End Date Taking? Authorizing Provider  busPIRone (BUSPAR) 15 MG tablet Take 7.5-15 mg by mouth daily as needed (anxiety).   Yes [provider]  dicyclomine (BENTYL) 20 MG tablet Take 1 tablet (20 mg total) by mouth 2 (two) times daily. 11/23/17  Yes Yu, Amy V, PA-C  famotidine (PEPCID) 20 MG tablet Take 1 tablet (20 mg total) by mouth daily. 01/18/19  Yes Samuel Jester, DO  hydrOXYzine  (ATARAX/VISTARIL) 25 MG tablet Take 1-2 tablets (25-50 mg total) by mouth every 6 (six) hours as needed for anxiety. 02/24/20  Yes Ward, Layla Maw, DO  levETIRAcetam (KEPPRA) 500 MG tablet Take 1 tablet (500 mg total) by mouth 2 (two) times daily. Patient taking differently: Take 1,000 mg by mouth 2 (two) times daily.  12/08/18  Yes Loren Racer, MD  lisinopril (ZESTRIL) 10 MG tablet Take 10 mg by mouth daily.   Yes [provider]  ondansetron (ZOFRAN ODT) 4 MG disintegrating tablet Take 1 tablet (4 mg total) by mouth every 8 (eight) hours as needed for nausea or vomiting. 11/23/17  Yes Yu, Amy V, PA-C  prednisoLONE acetate (PRED FORTE) 1 % ophthalmic suspension Place 2 drops into both eyes daily.   Yes [provider]  traMADol (ULTRAM) 50 MG tablet Take 1 tablet (50 mg total) by mouth every 6 (six) hours as needed. 02/10/18  Yes Hedges, Tinnie Gens, PA-C    Allergies    Amoxicillin, Peanut-containing drug products, and Penicillins  Review of Systems   Review of Systems  Neurological: Positive for seizures.  All other systems reviewed and are negative.   Physical Exam Updated Vital Signs BP (!) 146/87 (BP Location: Right Arm)   Pulse 81   Resp 20   Ht 5\' 11"  (1.803 m)  Wt 118.8 kg   SpO2 96%   BMI 36.54 kg/m   Physical Exam Vitals and nursing note reviewed.  Constitutional:      Appearance: He is well-developed.  HENT:     Head: Normocephalic and atraumatic.     Mouth/Throat:     Comments: No oral trauma Eyes:     Conjunctiva/sclera: Conjunctivae normal.  Cardiovascular:     Rate and Rhythm: Normal rate and regular rhythm.     Heart sounds: No murmur heard.   Pulmonary:     Effort: Pulmonary effort is normal. No respiratory distress.     Breath sounds: Normal breath sounds.  Abdominal:     Palpations: Abdomen is soft.     Tenderness: There is no abdominal tenderness.  Musculoskeletal:     Cervical back: Neck supple. Tenderness present.     Comments:  Cervical and upper thoracic spine ttp Moves all extremities  Skin:    General: Skin is warm and dry.  Neurological:     Mental Status: He is alert.  Psychiatric:        Mood and Affect: Mood normal.        Behavior: Behavior normal.     ED Results / Procedures / Treatments   Labs (all labs ordered are listed, but only abnormal results are displayed) Labs Reviewed  CBG MONITORING, ED - Abnormal; Notable for the following components:      Result Value   Glucose-Capillary 153 (*)    All other components within normal limits  CBC WITH DIFFERENTIAL/PLATELET  COMPREHENSIVE METABOLIC PANEL    EKG None  Radiology No results found.  Procedures Procedures (including critical care time)  Medications Ordered in ED Medications - No data to display  ED Course  I have reviewed the triage vital signs and the nursing notes.  Pertinent labs & imaging results that were available during my care of the patient were reviewed by me and considered in my medical decision making (see chart for details).    MDM Rules/Calculators/A&P                          This patient complains of seizure with associated fall with neck and back pain, this involves an extensive number of treatment options, and is a complaint that carries with it a high risk of complications and morbidity.  The differential diagnosis includes medication non-compliance, trauma from fall.  Pertinent Labs I ordered, reviewed, and interpreted labs, which included CBC and CMP which are normal.  Imaging Interpretation I ordered imaging studies which included CT of c-spine and x-ray of T-spine, which showed no acute traumatic findings.  Medications I ordered medication keppra for seizures.  Reassessments After the interventions stated above, I reevaluated the patient and found improved.  Back to baseline.  Ready for discharge.  Final Clinical Impression(s) / ED Diagnoses Final diagnoses:  Seizure [facility])    Rx / DC  Orders ED Discharge Orders         Ordered    levETIRAcetam (KEPPRA) 500 MG tablet  2 times daily     Discontinue  Reprint     04/17/20 0132           Roxy Horseman, PA-C 04/17/20 0134    Palumbo, April, MD 04/17/20 0148

## 2020-04-16 NOTE — ED Triage Notes (Signed)
As per [facility] pt had a witnessed seizure. Pt was postictal when EMS arrived at the home. Pt is currently alert and states he has not taken his keppra in months.

## 2020-04-17 LAB — COMPREHENSIVE METABOLIC PANEL
ALT: 18 U/L (ref 0–44)
AST: 19 U/L (ref 15–41)
Albumin: 4.1 g/dL (ref 3.5–5.0)
Alkaline Phosphatase: 61 U/L (ref 38–126)
Anion gap: 11 (ref 5–15)
BUN: 13 mg/dL (ref 6–20)
CO2: 24 mmol/L (ref 22–32)
Calcium: 9.2 mg/dL (ref 8.9–10.3)
Chloride: 106 mmol/L (ref 98–111)
Creatinine, Ser: 0.75 mg/dL (ref 0.61–1.24)
GFR calc Af Amer: >60 mL/min (ref >60)
GFR calc non Af Amer: >60 mL/min (ref >60)
Glucose, Bld: 95 mg/dL (ref 70–99)
Potassium: 3.7 mmol/L (ref 3.5–5.1)
Sodium: 141 mmol/L (ref 135–145)
Total Bilirubin: 0.7 mg/dL (ref 0.3–1.2)
Total Protein: 7.3 g/dL (ref 6.5–8.1)

## 2020-04-17 LAB — CBC WITH DIFFERENTIAL/PLATELET
Abs Immature Granulocytes: 0.01 10*3/uL (ref 0.00–0.07)
Basophils Absolute: 0 10*3/uL (ref 0.0–0.1)
Basophils Relative: 0 %
Eosinophils Absolute: 0.1 10*3/uL (ref 0.0–0.5)
Eosinophils Relative: 1 %
HCT: 42 % (ref 39.0–52.0)
Hemoglobin: 14.3 g/dL (ref 13.0–17.0)
Immature Granulocytes: 0 %
Lymphocytes Relative: 25 %
Lymphs Abs: 1.6 10*3/uL (ref 0.7–4.0)
MCH: 30 pg (ref 26.0–34.0)
MCHC: 34 g/dL (ref 30.0–36.0)
MCV: 88.2 fL (ref 80.0–100.0)
Monocytes Absolute: 0.5 10*3/uL (ref 0.1–1.0)
Monocytes Relative: 8 %
Neutro Abs: 4.1 10*3/uL (ref 1.7–7.7)
Neutrophils Relative %: 66 %
Platelets: 232 10*3/uL (ref 150–400)
RBC: 4.76 MIL/uL (ref 4.22–5.81)
RDW: 12.7 % (ref 11.5–15.5)
WBC: 6.3 10*3/uL (ref 4.0–10.5)
nRBC: 0 % (ref 0.0–0.2)

## 2020-04-17 MED ORDER — LEVETIRACETAM 500 MG PO TABS
1000.0000 mg | ORAL_TABLET | Freq: Two times a day (BID) | ORAL | 0 refills | Status: DC
Start: 2020-04-17 — End: 2022-01-08

## 2020-04-17 MED ORDER — LEVETIRACETAM IN NACL 1000 MG/100ML IV SOLN
1000.0000 mg | Freq: Once | INTRAVENOUS | Status: AC
  Administered 2020-04-17: 1000 mg via INTRAVENOUS
  Filled 2020-04-17: qty 100

## 2020-04-17 NOTE — Discharge Instructions (Signed)
Per [place] DMV statutes, patients with seizures are not allowed to drive until  they have been seizure-free for six months. Use caution when using heavy equipment or power tools. Avoid working on ladders or at heights. Take showers instead of baths. Ensure the water temperature is not too high on the home water heater. Do not go swimming alone. When caring for infants or small children, sit down when holding, feeding, or changing them to minimize risk of injury to the child in the event you have a seizure.   Also, Maintain good sleep hygiene. Avoid alcohol.  

## 2020-05-09 ENCOUNTER — Other Ambulatory Visit: Payer: Self-pay

## 2020-05-09 ENCOUNTER — Encounter (HOSPITAL_COMMUNITY): Payer: Self-pay

## 2020-05-09 DIAGNOSIS — Z5321 Procedure and treatment not carried out due to patient leaving prior to being seen by health care provider: Secondary | ICD-10-CM | POA: Insufficient documentation

## 2020-05-09 DIAGNOSIS — K625 Hemorrhage of anus and rectum: Secondary | ICD-10-CM | POA: Insufficient documentation

## 2020-05-09 DIAGNOSIS — R1030 Lower abdominal pain, unspecified: Secondary | ICD-10-CM | POA: Insufficient documentation

## 2020-05-09 LAB — CBC WITH DIFFERENTIAL/PLATELET
Abs Immature Granulocytes: 0.01 10*3/uL (ref 0.00–0.07)
Basophils Absolute: 0 10*3/uL (ref 0.0–0.1)
Basophils Relative: 0 %
Eosinophils Absolute: 0.1 10*3/uL (ref 0.0–0.5)
Eosinophils Relative: 1 %
HCT: 46.3 % (ref 39.0–52.0)
Hemoglobin: 15.3 g/dL (ref 13.0–17.0)
Immature Granulocytes: 0 %
Lymphocytes Relative: 26 %
Lymphs Abs: 1.9 10*3/uL (ref 0.7–4.0)
MCH: 29.4 pg (ref 26.0–34.0)
MCHC: 33 g/dL (ref 30.0–36.0)
MCV: 88.9 fL (ref 80.0–100.0)
Monocytes Absolute: 0.5 10*3/uL (ref 0.1–1.0)
Monocytes Relative: 6 %
Neutro Abs: 5 10*3/uL (ref 1.7–7.7)
Neutrophils Relative %: 67 %
Platelets: 219 10*3/uL (ref 150–400)
RBC: 5.21 MIL/uL (ref 4.22–5.81)
RDW: 12.8 % (ref 11.5–15.5)
WBC: 7.4 10*3/uL (ref 4.0–10.5)
nRBC: 0 % (ref 0.0–0.2)

## 2020-05-09 LAB — COMPREHENSIVE METABOLIC PANEL
ALT: 25 U/L (ref 0–44)
AST: 25 U/L (ref 15–41)
Albumin: 4.5 g/dL (ref 3.5–5.0)
Alkaline Phosphatase: 69 U/L (ref 38–126)
Anion gap: 12 (ref 5–15)
BUN: 14 mg/dL (ref 6–20)
CO2: 24 mmol/L (ref 22–32)
Calcium: 9.9 mg/dL (ref 8.9–10.3)
Chloride: 104 mmol/L (ref 98–111)
Creatinine, Ser: 0.79 mg/dL (ref 0.61–1.24)
GFR calc Af Amer: >60 mL/min (ref >60)
GFR calc non Af Amer: >60 mL/min (ref >60)
Glucose, Bld: 108 mg/dL — ABNORMAL HIGH (ref 70–99)
Potassium: 4.5 mmol/L (ref 3.5–5.1)
Sodium: 140 mmol/L (ref 135–145)
Total Bilirubin: 1 mg/dL (ref 0.3–1.2)
Total Protein: 8 g/dL (ref 6.5–8.1)

## 2020-05-09 NOTE — ED Notes (Signed)
Blood collected for a Type and screen if ordered

## 2020-05-09 NOTE — ED Triage Notes (Addendum)
Pt sts lower abdominal pain x 4 days and having dark blood clots in stool.

## 2020-05-10 ENCOUNTER — Emergency Department (HOSPITAL_COMMUNITY)
Admission: EM | Admit: 2020-05-10 | Discharge: 2020-05-10 | Disposition: A | Discharge status: Home / Self Care | Payer: Self-pay | Attending: Emergency Medicine | Admitting: Emergency Medicine

## 2021-07-22 ENCOUNTER — Other Ambulatory Visit: Payer: Self-pay

## 2021-07-22 ENCOUNTER — Ambulatory Visit (HOSPITAL_COMMUNITY)
Admission: EM | Admit: 2021-07-22 | Discharge: 2021-07-22 | Disposition: A | Discharge status: Home / Self Care | Payer: Self-pay | Attending: Family Medicine | Admitting: Family Medicine

## 2021-07-22 ENCOUNTER — Encounter (HOSPITAL_COMMUNITY): Payer: Self-pay | Admitting: Emergency Medicine

## 2021-07-22 DIAGNOSIS — Z113 Encounter for screening for infections with a predominantly sexual mode of transmission: Secondary | ICD-10-CM | POA: Insufficient documentation

## 2021-07-22 DIAGNOSIS — R369 Urethral discharge, unspecified: Secondary | ICD-10-CM | POA: Insufficient documentation

## 2021-07-22 LAB — HIV ANTIBODY (ROUTINE TESTING W REFLEX): HIV Screen 4th Generation wRfx: NONREACTIVE

## 2021-07-22 NOTE — ED Provider Notes (Signed)
[facility]    CSN: [id number] Arrival date & time: 07/22/21  1133      History   Chief Complaint Chief Complaint  Patient presents with   SEXUALLY TRANSMITTED DISEASE    HPI Spencer Davis is a 30 y.o. male.   Patient presenting today requesting a full panel of STD screening.  Notes that he recently had an affair and has since had some mild amounts of abnormal penile discharge and slight tingling with urination.  Denies rashes, fever, chills, nausea, vomiting, pelvic or abdominal pain, urinary frequency, hematuria.  Not trying anything over-the-counter for symptoms.   Past Medical History:  Diagnosis Date   Asthma    Head trauma    Hypertension    Seizure (HCC)     There are no problems to display for this patient.   History reviewed. No pertinent surgical history.     Home Medications    Prior to Admission medications   Medication Sig Start Date End Date Taking? Authorizing Provider  busPIRone (BUSPAR) 15 MG tablet Take 7.5-15 mg by mouth daily as needed (anxiety).    [provider]  dicyclomine (BENTYL) 20 MG tablet Take 1 tablet (20 mg total) by mouth 2 (two) times daily. 11/23/17   Cathie Hoops, Amy V, PA-C  famotidine (PEPCID) 20 MG tablet Take 1 tablet (20 mg total) by mouth daily. 01/18/19   Samuel Jester, DO  hydrOXYzine (ATARAX/VISTARIL) 25 MG tablet Take 1-2 tablets (25-50 mg total) by mouth every 6 (six) hours as needed for anxiety. 02/24/20   Ward, Layla Maw, DO  levETIRAcetam (KEPPRA) 500 MG tablet Take 2 tablets (1,000 mg total) by mouth 2 (two) times daily. 04/17/20   Roxy Horseman, PA-C  lisinopril (ZESTRIL) 10 MG tablet Take 10 mg by mouth daily.    [provider]  ondansetron (ZOFRAN ODT) 4 MG disintegrating tablet Take 1 tablet (4 mg total) by mouth every 8 (eight) hours as needed for nausea or vomiting. 11/23/17   Cathie Hoops, Amy V, PA-C  prednisoLONE acetate (PRED FORTE) 1 % ophthalmic suspension Place 2 drops into both eyes daily.     [provider]  traMADol (ULTRAM) 50 MG tablet Take 1 tablet (50 mg total) by mouth every 6 (six) hours as needed. 02/10/18   Eyvonne Mechanic, PA-C    Family History History reviewed. No pertinent family history.  Social History Social History   Tobacco Use   Smoking status: Former    Packs/day: 0.25    Types: Cigarettes   Smokeless tobacco: Never  Vaping Use   Vaping Use: Never used  Substance Use Topics   Alcohol use: Not Currently    Comment: "rarely"   Drug use: Not Currently     Allergies   Amoxicillin, Peanut-containing drug products, and Penicillins   Review of Systems Review of Systems Per HPI  Physical Exam Triage Vital Signs ED Triage Vitals  Enc Vitals Group     BP 07/22/21 1253 (!) 139/100     Pulse Rate 07/22/21 1253 86     Resp 07/22/21 1253 18     Temp 07/22/21 1255 97.8 F (36.6 C)     Temp src --      SpO2 07/22/21 1253 99 %     Weight --      Height --      Head Circumference --      Peak Flow --      Pain Score 07/22/21 1252 0     Pain Loc --  Pain Edu? --      Excl. in GC? --    No data found.  Updated Vital Signs BP (!) 139/100   Pulse 86   Temp 97.8 F (36.6 C)   Resp 18   SpO2 99%   Visual Acuity Right Eye Distance:   Left Eye Distance:   Bilateral Distance:    Right Eye Near:   Left Eye Near:    Bilateral Near:     Physical Exam Vitals and nursing note reviewed.  Constitutional:      Appearance: Normal appearance.  HENT:     Head: Atraumatic.  Eyes:     Extraocular Movements: Extraocular movements intact.     Conjunctiva/sclera: Conjunctivae normal.  Cardiovascular:     Rate and Rhythm: Normal rate and regular rhythm.  Pulmonary:     Effort: Pulmonary effort is normal.     Breath sounds: Normal breath sounds.  Abdominal:     General: Bowel sounds are normal. There is no distension.     Palpations: Abdomen is soft.     Tenderness: There is no abdominal tenderness. There is no right CVA  tenderness, left CVA tenderness or guarding.  Genitourinary:    Comments: GU exam deferred, self swab performed Musculoskeletal:        General: Normal range of motion.     Cervical back: Normal range of motion and neck supple.  Skin:    General: Skin is warm and dry.  Neurological:     General: No focal deficit present.     Mental Status: He is oriented to person, place, and time.  Psychiatric:        Mood and Affect: Mood normal.        Thought Content: Thought content normal.        Judgment: Judgment normal.     UC Treatments / Results  Labs (all labs ordered are listed, but only abnormal results are displayed) Labs Reviewed  HIV ANTIBODY (ROUTINE TESTING W REFLEX)  RPR  CYTOLOGY, (ORAL, ANAL, URETHRAL) ANCILLARY ONLY    EKG   Radiology No results found.  Procedures Procedures (including critical care time)  Medications Ordered in UC Medications - No data to display  Initial Impression / Assessment and Plan / UC Course  I have reviewed the triage vital signs and the nursing notes.  Pertinent labs & imaging results that were available during my care of the patient were reviewed by me and considered in my medical decision making (see chart for details).     Cytology swab, HIV and syphilis labs pending, will treat based on results.  Discussed abstinence until results return and safe sexual practices.   Final Clinical Impressions(s) / UC Diagnoses   Final diagnoses:  Routine screening for STI (sexually transmitted infection)  Penile discharge   Discharge Instructions   None    ED Prescriptions   None    PDMP not reviewed this encounter.   Particia Nearing, [place] 07/22/21 1349

## 2021-07-22 NOTE — ED Triage Notes (Addendum)
Pr is present today for STD check including HIV. Pt denies any sx

## 2021-07-23 ENCOUNTER — Telehealth: Payer: Self-pay | Admitting: Emergency Medicine

## 2021-07-23 LAB — RPR: RPR Ser Ql: NONREACTIVE

## 2021-07-23 NOTE — Telephone Encounter (Signed)
Spoke with pt given normal blood results; swab for STD still pending; verbalized understanding

## 2021-07-24 LAB — CYTOLOGY, (ORAL, ANAL, URETHRAL) ANCILLARY ONLY
Chlamydia: POSITIVE — AB
Comment: NEGATIVE
Comment: NEGATIVE
Comment: NORMAL
Neisseria Gonorrhea: NEGATIVE
Trichomonas: NEGATIVE

## 2021-07-25 ENCOUNTER — Telehealth (HOSPITAL_COMMUNITY): Payer: Self-pay | Admitting: Emergency Medicine

## 2021-07-25 MED ORDER — DOXYCYCLINE HYCLATE 100 MG PO CAPS
100.0000 mg | ORAL_CAPSULE | Freq: Two times a day (BID) | ORAL | 0 refills | Status: AC
Start: 2021-07-25 — End: 2021-08-01

## 2021-09-21 ENCOUNTER — Other Ambulatory Visit: Payer: Self-pay

## 2021-09-21 ENCOUNTER — Ambulatory Visit (HOSPITAL_COMMUNITY)
Admission: EM | Admit: 2021-09-21 | Discharge: 2021-09-21 | Disposition: A | Discharge status: Home / Self Care | Payer: Self-pay | Attending: Physician Assistant | Admitting: Physician Assistant

## 2021-09-21 ENCOUNTER — Encounter (HOSPITAL_COMMUNITY): Payer: Self-pay

## 2021-09-21 DIAGNOSIS — Z113 Encounter for screening for infections with a predominantly sexual mode of transmission: Secondary | ICD-10-CM | POA: Insufficient documentation

## 2021-09-21 DIAGNOSIS — Z202 Contact with and (suspected) exposure to infections with a predominantly sexual mode of transmission: Secondary | ICD-10-CM | POA: Insufficient documentation

## 2021-09-21 MED ORDER — AZITHROMYCIN 500 MG PO TABS
2000.0000 mg | ORAL_TABLET | Freq: Once | ORAL | 0 refills | Status: AC
Start: 2021-09-21 — End: 2021-09-21

## 2021-09-21 MED ORDER — GENTAMICIN SULFATE 40 MG/ML IJ SOLN
INTRAMUSCULAR | Status: AC
  Filled 2021-09-21: qty 6

## 2021-09-21 MED ORDER — GENTAMICIN SULFATE 40 MG/ML IJ SOLN
240.0000 mg | Freq: Once | INTRAMUSCULAR | Status: AC
  Administered 2021-09-21: 240 mg via INTRAMUSCULAR

## 2021-09-21 NOTE — Discharge Instructions (Signed)
We are treating you for gonorrhea.  You were given an injection of gentamicin today and you need to take azithromycin 4 tablets at 1 time from the pharmacy.  It is very important that you abstain for sex for 7 days.  We will contact you if we need to arrange any additional treatment based on your results.  It is important use condoms with each sexual encounter.  If you have any concerns please return for reevaluation.

## 2021-09-21 NOTE — ED Provider Notes (Signed)
[facility]    CSN: [id number] Arrival date & time: 09/21/21  1536      History   Chief Complaint Chief Complaint  Patient presents with   SEXUALLY TRANSMITTED DISEASE    HPI Spencer Davis is a 30 y.o. male.   Patient presents today for STI evaluation and treatment.  Reports that his partner who has been sexually active with for the past several weeks as has a positive for bacterial vaginosis and gonorrhea.  He is requesting treatment for gonorrhea if appropriate today.  Denies any recent antibiotic use.  They do not consistently use condoms.  He does have a history of sexual transmitted infections including chlamydia but reports completing treatment and having negative test of cure.  He denies any current symptoms including dysuria, penile discharge, abdominal pain, nausea, vomiting, fever.   Past Medical History:  Diagnosis Date   Asthma    Head trauma    Hypertension    Seizure (HCC)     There are no problems to display for this patient.   History reviewed. No pertinent surgical history.     Home Medications    Prior to Admission medications   Medication Sig Start Date End Date Taking? Authorizing Provider  azithromycin (ZITHROMAX) 500 MG tablet Take 4 tablets (2,000 mg total) by mouth once for 1 dose. 09/21/21 09/21/21 Yes Jessilyn Catino K, PA-C  busPIRone (BUSPAR) 15 MG tablet Take 7.5-15 mg by mouth daily as needed (anxiety).    [provider]  dicyclomine (BENTYL) 20 MG tablet Take 1 tablet (20 mg total) by mouth 2 (two) times daily. 11/23/17   Cathie Hoops, Amy V, PA-C  famotidine (PEPCID) 20 MG tablet Take 1 tablet (20 mg total) by mouth daily. 01/18/19   Samuel Jester, DO  hydrOXYzine (ATARAX/VISTARIL) 25 MG tablet Take 1-2 tablets (25-50 mg total) by mouth every 6 (six) hours as needed for anxiety. 02/24/20   Ward, Layla Maw, DO  levETIRAcetam (KEPPRA) 500 MG tablet Take 2 tablets (1,000 mg total) by mouth 2 (two) times daily. 04/17/20   Roxy Horseman, PA-C  lisinopril (ZESTRIL) 10 MG tablet Take 10 mg by mouth daily.    [provider]  ondansetron (ZOFRAN ODT) 4 MG disintegrating tablet Take 1 tablet (4 mg total) by mouth every 8 (eight) hours as needed for nausea or vomiting. 11/23/17   Cathie Hoops, Amy V, PA-C  prednisoLONE acetate (PRED FORTE) 1 % ophthalmic suspension Place 2 drops into both eyes daily.    [provider]  traMADol (ULTRAM) 50 MG tablet Take 1 tablet (50 mg total) by mouth every 6 (six) hours as needed. 02/10/18   Eyvonne Mechanic, PA-C    Family History History reviewed. No pertinent family history.  Social History Social History   Tobacco Use   Smoking status: Former    Packs/day: 0.25    Types: Cigarettes   Smokeless tobacco: Never  Vaping Use   Vaping Use: Never used  Substance Use Topics   Alcohol use: Not Currently    Comment: "rarely"   Drug use: Not Currently     Allergies   Amoxicillin, Peanut-containing drug products, and Penicillins   Review of Systems Review of Systems  Constitutional:  Negative for activity change, appetite change, fatigue and fever.  Respiratory:  Negative for cough and shortness of breath.   Cardiovascular:  Negative for chest pain.  Gastrointestinal:  Negative for abdominal pain, diarrhea, nausea and vomiting.  Genitourinary:  Negative for dysuria, frequency, penile discharge, penile pain  and urgency.    Physical Exam Triage Vital Signs ED Triage Vitals  Enc Vitals Group     BP 09/21/21 1645 (!) 158/85     Pulse Rate 09/21/21 1644 (!) 110     Resp 09/21/21 1644 20     Temp 09/21/21 1644 98.3 F (36.8 C)     Temp Source 09/21/21 1644 Oral     SpO2 09/21/21 1644 96 %     Weight --      Height --      Head Circumference --      Peak Flow --      Pain Score 09/21/21 1643 0     Pain Loc --      Pain Edu? --      Excl. in GC? --    No data found.  Updated Vital Signs BP (!) 158/85   Pulse (!) 110   Temp 98.3 F (36.8 C) (Oral)   Resp 20    SpO2 96%   Visual Acuity Right Eye Distance:   Left Eye Distance:   Bilateral Distance:    Right Eye Near:   Left Eye Near:    Bilateral Near:     Physical Exam Vitals reviewed.  Constitutional:      General: He is awake.     Appearance: Normal appearance. He is well-developed. He is not ill-appearing.     Comments: Very pleasant male appears stated age in no acute distress sitting comfortably in exam room  HENT:     Head: Normocephalic and atraumatic.  Cardiovascular:     Rate and Rhythm: Normal rate and regular rhythm.     Heart sounds: Normal heart sounds, S1 normal and S2 normal. No murmur heard. Pulmonary:     Effort: Pulmonary effort is normal.     Breath sounds: Normal breath sounds. No stridor. No wheezing, rhonchi or rales.     Comments: Clear to auscultation bilaterally Abdominal:     General: Bowel sounds are normal.     Palpations: Abdomen is soft.     Tenderness: There is no abdominal tenderness.     Comments: Benign abdominal exam  Genitourinary:    Comments: Exam deferred Neurological:     Mental Status: He is alert.  Psychiatric:        Behavior: Behavior is cooperative.     UC Treatments / Results  Labs (all labs ordered are listed, but only abnormal results are displayed) Labs Reviewed  CYTOLOGY, (ORAL, ANAL, URETHRAL) ANCILLARY ONLY    EKG   Radiology No results found.  Procedures Procedures (including critical care time)  Medications Ordered in UC Medications  gentamicin (GARAMYCIN) injection 240 mg (240 mg Intramuscular Given 09/21/21 1805)    Initial Impression / Assessment and Plan / UC Course  I have reviewed the triage vital signs and the nursing notes.  Pertinent labs & imaging results that were available during my care of the patient were reviewed by me and considered in my medical decision making (see chart for details).     Will empirically treat for gonorrhea given multiple unprotected sexual encounters with someone  who was tested positive.  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  Patient was given gentamicin 240 mg IM during clinic visit and instructed to take 2 g of azithromycin when he gets home.  He is to abstain from sex for minimum of 7 days.  STI swab was collected and if he test positive  for trichomonas or chlamydia we will arrange additional treatment.  Discussed the importance of safe sex practices.  Discussed alarm symptoms that warrant emergent evaluation.  Strict return precautions given to which he expressed understanding.  Final Clinical Impressions(s) / UC Diagnoses   Final diagnoses:  Exposure to gonorrhea  Routine screening for STI (sexually transmitted infection)     Discharge Instructions      We are treating you for gonorrhea.  You were given an injection of gentamicin today and you need to take azithromycin 4 tablets at 1 time from the pharmacy.  It is very important that you abstain for sex for 7 days.  We will contact you if we need to arrange any additional treatment based on your results.  It is important use condoms with each sexual encounter.  If you have any concerns please return for reevaluation.     ED Prescriptions     Medication Sig Dispense Auth. Provider   azithromycin (ZITHROMAX) 500 MG tablet Take 4 tablets (2,000 mg total) by mouth once for 1 dose. 4 tablet Damonie Ellenwood, Noberto Retort, PA-C      PDMP not reviewed this encounter.   Jeani Hawking, PA-C 09/21/21 1807

## 2021-09-21 NOTE — ED Triage Notes (Signed)
Pt presents with exposure to Gonorrhea and BV. Pt states his partner made him aware of the results. Denies sxs.

## 2021-09-22 ENCOUNTER — Telehealth (HOSPITAL_COMMUNITY): Payer: Self-pay | Admitting: Emergency Medicine

## 2021-09-22 LAB — CYTOLOGY, (ORAL, ANAL, URETHRAL) ANCILLARY ONLY
Chlamydia: NEGATIVE
Comment: NEGATIVE
Comment: NEGATIVE
Comment: NORMAL
Neisseria Gonorrhea: NEGATIVE
Trichomonas: POSITIVE — AB

## 2021-09-22 MED ORDER — METRONIDAZOLE 500 MG PO TABS: 2000.0000 mg | ORAL_TABLET | Freq: Once | ORAL | 0 refills | Status: AC

## 2021-12-20 ENCOUNTER — Emergency Department (HOSPITAL_COMMUNITY): Payer: No Typology Code available for payment source

## 2021-12-20 ENCOUNTER — Emergency Department (HOSPITAL_COMMUNITY)
Admission: EM | Admit: 2021-12-20 | Discharge: 2021-12-20 | Disposition: A | Discharge status: Home / Self Care | Payer: No Typology Code available for payment source | Attending: Emergency Medicine | Admitting: Emergency Medicine

## 2021-12-20 DIAGNOSIS — Z91010 Allergy to peanuts: Secondary | ICD-10-CM | POA: Diagnosis not present

## 2021-12-20 DIAGNOSIS — Z7951 Long term (current) use of inhaled steroids: Secondary | ICD-10-CM | POA: Insufficient documentation

## 2021-12-20 DIAGNOSIS — R202 Paresthesia of skin: Secondary | ICD-10-CM | POA: Diagnosis present

## 2021-12-20 DIAGNOSIS — I10 Essential (primary) hypertension: Secondary | ICD-10-CM | POA: Insufficient documentation

## 2021-12-20 DIAGNOSIS — R079 Chest pain, unspecified: Secondary | ICD-10-CM | POA: Diagnosis not present

## 2021-12-20 DIAGNOSIS — Z20822 Contact with and (suspected) exposure to covid-19: Secondary | ICD-10-CM | POA: Insufficient documentation

## 2021-12-20 DIAGNOSIS — J45909 Unspecified asthma, uncomplicated: Secondary | ICD-10-CM | POA: Diagnosis not present

## 2021-12-20 DIAGNOSIS — G44319 Acute post-traumatic headache, not intractable: Secondary | ICD-10-CM | POA: Insufficient documentation

## 2021-12-20 DIAGNOSIS — R519 Headache, unspecified: Secondary | ICD-10-CM

## 2021-12-20 LAB — COMPREHENSIVE METABOLIC PANEL
ALT: 24 U/L (ref 0–44)
AST: 18 U/L (ref 15–41)
Albumin: 4.3 g/dL (ref 3.5–5.0)
Alkaline Phosphatase: 83 U/L (ref 38–126)
Anion gap: 10 (ref 5–15)
BUN: 10 mg/dL (ref 6–20)
CO2: 23 mmol/L (ref 22–32)
Calcium: 9.5 mg/dL (ref 8.9–10.3)
Chloride: 106 mmol/L (ref 98–111)
Creatinine, Ser: 0.88 mg/dL (ref 0.61–1.24)
GFR, Estimated: >60 mL/min (ref >60)
Glucose, Bld: 94 mg/dL (ref 70–99)
Potassium: 4 mmol/L (ref 3.5–5.1)
Sodium: 139 mmol/L (ref 135–145)
Total Bilirubin: 0.5 mg/dL (ref 0.3–1.2)
Total Protein: 8.1 g/dL (ref 6.5–8.1)

## 2021-12-20 LAB — CBC WITH DIFFERENTIAL/PLATELET
Abs Immature Granulocytes: 0.02 10*3/uL (ref 0.00–0.07)
Basophils Absolute: 0 10*3/uL (ref 0.0–0.1)
Basophils Relative: 0 %
Eosinophils Absolute: 0.1 10*3/uL (ref 0.0–0.5)
Eosinophils Relative: 1 %
HCT: 46.5 % (ref 39.0–52.0)
Hemoglobin: 15.8 g/dL (ref 13.0–17.0)
Immature Granulocytes: 0 %
Lymphocytes Relative: 25 %
Lymphs Abs: 2.4 10*3/uL (ref 0.7–4.0)
MCH: 29.8 pg (ref 26.0–34.0)
MCHC: 34 g/dL (ref 30.0–36.0)
MCV: 87.7 fL (ref 80.0–100.0)
Monocytes Absolute: 0.6 10*3/uL (ref 0.1–1.0)
Monocytes Relative: 6 %
Neutro Abs: 6.5 10*3/uL (ref 1.7–7.7)
Neutrophils Relative %: 68 %
Platelets: 266 10*3/uL (ref 150–400)
RBC: 5.3 MIL/uL (ref 4.22–5.81)
RDW: 12.8 % (ref 11.5–15.5)
WBC: 9.6 10*3/uL (ref 4.0–10.5)
nRBC: 0 % (ref 0.0–0.2)

## 2021-12-20 LAB — RESP PANEL BY RT-PCR (FLU A&B, COVID) ARPGX2
Influenza A by PCR: NEGATIVE
Influenza B by PCR: NEGATIVE
SARS Coronavirus 2 by RT PCR: NEGATIVE

## 2021-12-20 LAB — TROPONIN I (HIGH SENSITIVITY)
Troponin I (High Sensitivity): 4 ng/L (ref <18)
Troponin I (High Sensitivity): 4 ng/L (ref <18)

## 2021-12-20 LAB — BRAIN NATRIURETIC PEPTIDE: B Natriuretic Peptide: 38.1 pg/mL (ref 0.0–100.0)

## 2021-12-20 MED ORDER — AMLODIPINE BESYLATE 2.5 MG PO TABS
2.5000 mg | ORAL_TABLET | Freq: Every day | ORAL | 0 refills | Status: DC
Start: 2021-12-20 — End: 2022-01-08

## 2021-12-20 MED ORDER — IOHEXOL 350 MG/ML SOLN
100.0000 mL | Freq: Once | INTRAVENOUS | Status: AC | PRN
  Administered 2021-12-20: 100 mL via INTRAVENOUS

## 2021-12-20 NOTE — ED Provider Notes (Signed)
[facility]-EMERGENCY DEPT Provider Note   CSN: [id number] Arrival date & time: 12/20/21  1444     History  Chief Complaint  Patient presents with   Numbness    Spencer Davis is a 31 y.o. male.  HPI      31 year old male with history of hypertension, head trauma, seizure, asthma, who presents with concern for numbness of his right foot beginning 4 days ago.  4 days ago had used cocaine, felt numbness then came to and felt like numbness right foot/right pinky toes.  Had not used anything since 2018, does not want to use again.   Constant numbness right pinky and 4th toe, like total numbness Big toe on the left tingling coming and going, nothing seems to make it better or worse, nothing makes that better or worse. Pain behind the numbness more comes and goes Does have headache that has been waxing and waning over last few days  NO other new numbness or weakness, trouble walking, talking, new visual changes, does feel like can't open eyes a lot  Feeling heart rate coming up and down, fluttering, last night felt faint. Feeling fatigue Now having chest pain, not heavy but can tell its a pain. Dyspnea started day before yesterday, felt like throat dry, does have some orthopnea  Fan by bed, can't feel anything  Had full sensation of feet, then numb  Just started back taking keppra because had seizure, not taking the bp medication now  Had diarrhea, tested self for covid, sometimes feels like certain areas are hot but no fever, no cough  Hx of cigarette smoking, mom had hx of stroke   Past Medical History:  Diagnosis Date   Asthma    Head trauma    Hypertension    Seizure (HCC)     No past surgical history on file.  Home Medications Prior to Admission medications   Medication Sig Start Date End Date Taking? Authorizing Provider  amLODipine (NORVASC) 2.5 MG tablet Take 1 tablet (2.5 mg total) by mouth daily. 12/20/21 01/19/22 Yes Kylia Grajales, Denny PeonErin, MD   busPIRone (BUSPAR) 15 MG tablet Take 7.5-15 mg by mouth daily as needed (anxiety).    [provider]  dicyclomine (BENTYL) 20 MG tablet Take 1 tablet (20 mg total) by mouth 2 (two) times daily. 11/23/17   Cathie HoopsYu, Amy V, PA-C  famotidine (PEPCID) 20 MG tablet Take 1 tablet (20 mg total) by mouth daily. 01/18/19   Samuel JesterMcManus, Kathleen, DO  hydrOXYzine (ATARAX/VISTARIL) 25 MG tablet Take 1-2 tablets (25-50 mg total) by mouth every 6 (six) hours as needed for anxiety. 02/24/20   Ward, Layla MawKristen N, DO  levETIRAcetam (KEPPRA) 500 MG tablet Take 2 tablets (1,000 mg total) by mouth 2 (two) times daily. 04/17/20   Roxy HorsemanBrowning, Robert, PA-C  lisinopril (ZESTRIL) 10 MG tablet Take 10 mg by mouth daily.    [provider]  ondansetron (ZOFRAN ODT) 4 MG disintegrating tablet Take 1 tablet (4 mg total) by mouth every 8 (eight) hours as needed for nausea or vomiting. 11/23/17   Cathie HoopsYu, Amy V, PA-C  prednisoLONE acetate (PRED FORTE) 1 % ophthalmic suspension Place 2 drops into both eyes daily.    [provider]  traMADol (ULTRAM) 50 MG tablet Take 1 tablet (50 mg total) by mouth every 6 (six) hours as needed. 02/10/18   Hedges, Tinnie GensJeffrey, PA-C      Allergies    Amoxicillin, Peanut-containing drug products, and Penicillins    Review of Systems  Review of Systems See above  Physical Exam Updated Vital Signs BP (!) 141/68    Pulse 79    Temp 97.8 F (36.6 C) (Oral)    Resp 17    Ht 5\' 9"  (1.753 m)    Wt (!) 137.9 kg    SpO2 99%    BMI 44.89 kg/m  Physical Exam Vitals and nursing note reviewed.  Constitutional:      General: He is not in acute distress.    Appearance: He is well-developed. He is not diaphoretic.  HENT:     Head: Normocephalic and atraumatic.  Eyes:     Conjunctiva/sclera: Conjunctivae normal.  Cardiovascular:     Rate and Rhythm: Normal rate and regular rhythm.     Heart sounds: Normal heart sounds. No murmur heard.   No friction rub. No gallop.  Pulmonary:     Effort: Pulmonary  effort is normal. No respiratory distress.     Breath sounds: Normal breath sounds. No wheezing or rales.  Abdominal:     General: There is no distension.     Palpations: Abdomen is soft.     Tenderness: There is no abdominal tenderness. There is no guarding.  Musculoskeletal:     Cervical back: Normal range of motion.     Comments: Normal pulses, normal coloring to bialteral feet  Skin:    General: Skin is warm and dry.     Comments: Normal coloring to bilateral feet and toes, small blood blister to left great toe prox to nail, cap refill equal in all toes bilaterally  Neurological:     Mental Status: He is alert and oriented to person, place, and time.     Motor: No weakness.     Comments: Numbness to right pinky toe, fourth toe, third toe Reports altered sensation to left big toe Normal strength,normal finger to nose, symmetric facies/tongue/uvula     ED Results / Procedures / Treatments   Labs (all labs ordered are listed, but only abnormal results are displayed) Labs Reviewed  RESP PANEL BY RT-PCR (FLU A&B, COVID) [id number]  CBC WITH DIFFERENTIAL/PLATELET  COMPREHENSIVE METABOLIC PANEL  BRAIN NATRIURETIC PEPTIDE  RPR  TROPONIN I (HIGH SENSITIVITY)  TROPONIN I (HIGH SENSITIVITY)    EKG EKG Interpretation  Date/Time:  Wednesday December 20 2021 16:31:27 EST Ventricular Rate:  79 PR Interval:  180 QRS Duration: 108 QT Interval:  358 QTC Calculation: 411 R Axis:   99 Text Interpretation: Sinus rhythm Borderline right axis deviation 12 Lead; Mason-Likar No significant change since last tracing Confirmed by [id number] (Alvira Monday) on 12/20/2021 5:56:09 PM  Radiology CT Head Wo Contrast  Result Date: 12/20/2021 CLINICAL DATA:  Headaches EXAM: CT HEAD WITHOUT CONTRAST TECHNIQUE: Contiguous axial images were obtained from the base of the skull through the vertex without intravenous contrast. RADIATION DOSE REDUCTION: This exam was performed according to the departmental  dose-optimization program which includes automated exposure control, adjustment of the mA and/or kV according to patient size and/or use of iterative reconstruction technique. COMPARISON:  02/23/2018 FINDINGS: Brain: No acute intracranial abnormalities are seen. There are no signs of bleeding. Ventricles are unremarkable. There is no focal mass effect. Vascular: Unremarkable. Skull: Unremarkable. Sinuses/Orbits: There is mild mucosal thickening in the ethmoid and maxillary sinuses. There are fewer than usual mastoid air cells on both sides without break in the cortical margins. Other: None IMPRESSION: No acute intracranial findings are seen in noncontrast CT brain. Chronic sinusitis. Electronically Signed   By: 04/25/2018.D.  On: 12/20/2021 18:14   DG Chest Portable 1 View  Result Date: 12/20/2021 CLINICAL DATA:  Chest pain EXAM: PORTABLE CHEST 1 VIEW COMPARISON:  02/20/2020 FINDINGS: The heart size and mediastinal contours are within normal limits. Both lungs are clear. The visualized skeletal structures are unremarkable. IMPRESSION: No active disease. Electronically Signed   By: Ernie Avena M.D.   On: 12/20/2021 16:58   CT Angio Chest/Abd/Pel for Dissection W and/or Wo Contrast  Result Date: 12/20/2021 CLINICAL DATA:  Chest and back pain for 4 days. EXAM: CT ANGIOGRAPHY CHEST, ABDOMEN AND PELVIS TECHNIQUE: Non-contrast CT of the chest was initially obtained. Multidetector CT imaging through the chest, abdomen and pelvis was performed using the standard protocol during bolus administration of intravenous contrast. Multiplanar reconstructed images and MIPs were obtained and reviewed to evaluate the vascular anatomy. RADIATION DOSE REDUCTION: This exam was performed according to the departmental dose-optimization program which includes automated exposure control, adjustment of the mA and/or kV according to patient size and/or use of iterative reconstruction technique. CONTRAST:   OMNIPAQUE IOHEXOL 350 MG/ML SOLN COMPARISON:  Abdominal CT scan from 2020 FINDINGS: CTA CHEST FINDINGS Cardiovascular: The heart is normal in size. No pericardial effusion. The aorta is normal in caliber. No dissection or atherosclerotic calcification. The pulmonary arteries are normal. No findings for pulmonary embolism. Mediastinum/Nodes: No mediastinal or hilar mass or lymphadenopathy. The esophagus is grossly normal. Lungs/Pleura: No acute pulmonary findings. No infiltrates, edema effusions. No pulmonary lesions. Musculoskeletal: No chest wall mass, supraclavicular or axillary adenopathy. The bony thorax is intact. Review of the MIP images confirms the above findings. CTA ABDOMEN AND PELVIS FINDINGS VASCULAR Aorta: Normal Celiac: Normal SMA: Normal Renals: Normal IMA: Normal Inflow: Normal Veins: Normal Review of the MIP images confirms the above findings. NON-VASCULAR Hepatobiliary: No hepatic lesions are identified. The gallbladder is grossly normal. No common bile duct dilatation. Pancreas: No mass, inflammation or ductal dilatation. Spleen: Normal size.  No focal lesions. Adrenals/Urinary Tract: The adrenal glands and kidneys are unremarkable. The bladder is. Stomach/Bowel: The stomach, duodenum, small bowel and colon are unremarkable. The appendix is normal. Lymphatic: No abdominopelvic lymphadenopathy. Small scattered lymph nodes are noted. Reproductive: The prostate gland and seminal vesicles unremarkable. Other: No pelvic mass or adenopathy. No free pelvic fluid collections. No inguinal mass or adenopathy. No abdominal wall hernia or subcutaneous lesions. Musculoskeletal: No significant bony findings. Review of the MIP images confirms the above findings. IMPRESSION: 1. Normal CT angiogram of the chest, abdomen and pelvis. 2. No acute pulmonary findings. 3. No acute abdominal/pelvic findings. Electronically Signed   By: Rudie Meyer M.D.   On: 12/20/2021 18:18    Procedures Procedures     Medications Ordered in ED Medications  iohexol (OMNIPAQUE) 350 MG/ML injection 100 mL (100 mLs Intravenous Contrast Given 12/20/21 1753)    ED Course/ Medical Decision Making/ A&P      31 year old male with history of hypertension, head trauma, seizure, asthma, who presents with concern for chest pain and numbness of his right foot beginning 4 days ago.  DDx includes aortic dissection, ACS, pneumonia, pneumothorax, COVID 19, CHF, acute lower extremity ischemia, embolic phenomena to spine, distal blood vessels, intracranial etiology, syphilis, vitamin deficiency, other peripheral neuropathy.  Given headache with neurologic symptoms, CT head was completed and evaluated by me and showed no acute findings.  Chest x-ray evaluated by me and showed no evidence of pneumonia, pulmonary edema, pneumothorax.  Labs were personally evaluated by me and show no sign of anemia, no leukocytosis,  no clinically significant electrolyte abnormality, normal troponin.  BNP normal, doubt CHF. COVID and flu testing are negative.  Have low clinical suspicion given negative troponin and lab findings for congestive heart failure, ACS, and have low clinical suspicion for pulmonary embolus.  Given history of cocaine use is a risk factor, significant hypertension, areas of numbness with chest pain, CTA dissection study was ordered and showed no acute findings.  He has normal pulses bilaterally and have low suspicion for acute arterial thrombus.  In setting of CTA without acute findings, no atherosclerotic calcifications , similar cap refill and temperature of toes bilaterally, have low suspicion for significant more distal occlusion at this time that would need emergent intervention. Distribution of symptoms not consistent with CVA.  Do not see indication for admission or surgery.  Recommend medication for BP control and PCP and Neurology follow up.  Patient discharged in stable condition with understanding of reasons to  return.            Final Clinical Impression(s) / ED Diagnoses Final diagnoses:  Paresthesia  Chest pain, unspecified type  Acute nonintractable headache, unspecified headache type    Rx / DC Orders ED Discharge Orders          Ordered    amLODipine (NORVASC) 2.5 MG tablet  Daily        12/20/21 2019              Alvira Monday, MD 12/21/21 [phone]

## 2021-12-20 NOTE — Progress Notes (Signed)
CSW met with pt regarding lack of PCP.  Pt reports that he has new health insurance, believes it is [employer], which will be effective soon.  Discussed calling them for options for PCP.  CSW also provided information for [facility] and [facility] and discussed that they can see him for primary care even without insurance.  Pt verbalized understanding. ?Lurline Idol, MSW, LCSW ?3/8/20235:42 PM  ?

## 2021-12-20 NOTE — ED Triage Notes (Signed)
Pt states his right foot started going numb about 4 days ago. Pt also states his left great toe goes numb. Pt reports this all started after doing cocaine.  ?

## 2021-12-20 NOTE — Discharge Instructions (Addendum)
It was a pleasure caring for you today! I wrote you a prescription for low dose blood pressure medication, this will likely need to be increased as an outpatient if your blood pressures are still high. ?

## 2021-12-21 LAB — RPR: RPR Ser Ql: NONREACTIVE

## 2022-01-08 ENCOUNTER — Encounter (HOSPITAL_COMMUNITY): Payer: Self-pay | Admitting: Emergency Medicine

## 2022-01-08 ENCOUNTER — Emergency Department (HOSPITAL_COMMUNITY): Payer: No Typology Code available for payment source

## 2022-01-08 ENCOUNTER — Other Ambulatory Visit: Payer: Self-pay

## 2022-01-08 ENCOUNTER — Emergency Department (HOSPITAL_COMMUNITY)
Admission: EM | Admit: 2022-01-08 | Discharge: 2022-01-08 | Payer: No Typology Code available for payment source | Attending: Emergency Medicine | Admitting: Emergency Medicine

## 2022-01-08 DIAGNOSIS — R569 Unspecified convulsions: Secondary | ICD-10-CM | POA: Diagnosis present

## 2022-01-08 DIAGNOSIS — M542 Cervicalgia: Secondary | ICD-10-CM | POA: Insufficient documentation

## 2022-01-08 DIAGNOSIS — J45909 Unspecified asthma, uncomplicated: Secondary | ICD-10-CM | POA: Insufficient documentation

## 2022-01-08 DIAGNOSIS — I10 Essential (primary) hypertension: Secondary | ICD-10-CM | POA: Insufficient documentation

## 2022-01-08 DIAGNOSIS — R519 Headache, unspecified: Secondary | ICD-10-CM | POA: Insufficient documentation

## 2022-01-08 DIAGNOSIS — Z91010 Allergy to peanuts: Secondary | ICD-10-CM | POA: Insufficient documentation

## 2022-01-08 LAB — RAPID URINE DRUG SCREEN, HOSP PERFORMED
Amphetamines: NOT DETECTED
Barbiturates: NOT DETECTED
Benzodiazepines: NOT DETECTED
Cocaine: NOT DETECTED
Opiates: NOT DETECTED
Tetrahydrocannabinol: POSITIVE — AB

## 2022-01-08 LAB — CBC WITH DIFFERENTIAL/PLATELET
Abs Immature Granulocytes: 0.01 10*3/uL (ref 0.00–0.07)
Basophils Absolute: 0 10*3/uL (ref 0.0–0.1)
Basophils Relative: 0 %
Eosinophils Absolute: 0.1 10*3/uL (ref 0.0–0.5)
Eosinophils Relative: 1 %
HCT: 46 % (ref 39.0–52.0)
Hemoglobin: 16 g/dL (ref 13.0–17.0)
Immature Granulocytes: 0 %
Lymphocytes Relative: 26 %
Lymphs Abs: 1.8 10*3/uL (ref 0.7–4.0)
MCH: 30.1 pg (ref 26.0–34.0)
MCHC: 34.8 g/dL (ref 30.0–36.0)
MCV: 86.6 fL (ref 80.0–100.0)
Monocytes Absolute: 0.5 10*3/uL (ref 0.1–1.0)
Monocytes Relative: 7 %
Neutro Abs: 4.5 10*3/uL (ref 1.7–7.7)
Neutrophils Relative %: 66 %
Platelets: 239 10*3/uL (ref 150–400)
RBC: 5.31 MIL/uL (ref 4.22–5.81)
RDW: 12.8 % (ref 11.5–15.5)
WBC: 6.8 10*3/uL (ref 4.0–10.5)
nRBC: 0 % (ref 0.0–0.2)

## 2022-01-08 LAB — COMPREHENSIVE METABOLIC PANEL
ALT: 18 U/L (ref 0–44)
AST: 19 U/L (ref 15–41)
Albumin: 3.9 g/dL (ref 3.5–5.0)
Alkaline Phosphatase: 82 U/L (ref 38–126)
Anion gap: 9 (ref 5–15)
BUN: 9 mg/dL (ref 6–20)
CO2: 23 mmol/L (ref 22–32)
Calcium: 9.3 mg/dL (ref 8.9–10.3)
Chloride: 106 mmol/L (ref 98–111)
Creatinine, Ser: 0.75 mg/dL (ref 0.61–1.24)
GFR, Estimated: >60 mL/min (ref >60)
Glucose, Bld: 137 mg/dL — ABNORMAL HIGH (ref 70–99)
Potassium: 3.7 mmol/L (ref 3.5–5.1)
Sodium: 138 mmol/L (ref 135–145)
Total Bilirubin: 0.8 mg/dL (ref 0.3–1.2)
Total Protein: 7.4 g/dL (ref 6.5–8.1)

## 2022-01-08 LAB — URINALYSIS, ROUTINE W REFLEX MICROSCOPIC
Bilirubin Urine: NEGATIVE
Glucose, UA: NEGATIVE mg/dL
Hgb urine dipstick: NEGATIVE
Ketones, ur: NEGATIVE mg/dL
Leukocytes,Ua: NEGATIVE
Nitrite: NEGATIVE
Protein, ur: 30 mg/dL — AB
Specific Gravity, Urine: 1.023 (ref 1.005–1.030)
pH: 6 (ref 5.0–8.0)

## 2022-01-08 LAB — MAGNESIUM: Magnesium: 1.9 mg/dL (ref 1.7–2.4)

## 2022-01-08 LAB — ETHANOL: Alcohol, Ethyl (B): <10 mg/dL (ref <10)

## 2022-01-08 MED ORDER — LISINOPRIL 10 MG PO TABS: 10.0000 mg | ORAL_TABLET | Freq: Every day | ORAL | 0 refills | Status: AC

## 2022-01-08 MED ORDER — LEVETIRACETAM 500 MG/5ML IV SOLN
2000.0000 mg | Freq: Once | INTRAVENOUS | Status: AC
  Administered 2022-01-08: 2000 mg via INTRAVENOUS
  Filled 2022-01-08: qty 20

## 2022-01-08 MED ORDER — AMLODIPINE BESYLATE 2.5 MG PO TABS: 2.5000 mg | ORAL_TABLET | Freq: Every day | ORAL | 0 refills | Status: AC

## 2022-01-08 MED ORDER — ACETAMINOPHEN 500 MG PO TABS
1000.0000 mg | ORAL_TABLET | Freq: Once | ORAL | Status: AC
  Administered 2022-01-08: 1000 mg via ORAL
  Filled 2022-01-08: qty 2

## 2022-01-08 MED ORDER — LEVETIRACETAM 500 MG PO TABS: 1000.0000 mg | ORAL_TABLET | Freq: Two times a day (BID) | ORAL | 0 refills | Status: AC

## 2022-01-08 MED ORDER — SODIUM CHLORIDE 0.9 % IV SOLN: INTRAVENOUS | Status: DC

## 2022-01-08 MED ORDER — SODIUM CHLORIDE 0.9 % IV BOLUS
1000.0000 mL | Freq: Once | INTRAVENOUS | Status: AC
  Administered 2022-01-08: 1000 mL via INTRAVENOUS

## 2022-01-08 NOTE — ED Provider Notes (Signed)
?[facility]-EMERGENCY DEPT ?Provider Note ? ? ?CSN: [id number] ?Arrival date & time: 01/08/22  1918 ? ?  ? ?History ? ?Chief Complaint  ?Patient presents with  ? Seizures  ? ? ?Arletha PiliJoshua Feltes is a 31 y.o. male. ? ?Pt is a 31 yo male who has a pmhx significant for seizures, htn, and asthma.  Pt has been in jail since yesterday.  He has not had any of his seizure meds since 3/25.  Pt was found unresponsive in his jail cell this evening.  Pt was given 12 mg of narcan IN prior to EMS arrival.  Pt was starting to come around and was a little confused when EMS arrived.  He is now awake and alert.  He does not recall what happened.  He c/o of head and neck pain.  He denies any drug use. ? ? ? ?  ? ?Home Medications ?Prior to Admission medications   ?Medication Sig Start Date End Date Taking? Authorizing Provider  ?amLODipine (NORVASC) 2.5 MG tablet Take 1 tablet (2.5 mg total) by mouth daily. 01/08/22 02/07/22  Jacalyn LefevreHaviland, Makinzee Durley, MD  ?busPIRone (BUSPAR) 15 MG tablet Take 7.5-15 mg by mouth daily as needed (anxiety).    [provider]  ?dicyclomine (BENTYL) 20 MG tablet Take 1 tablet (20 mg total) by mouth 2 (two) times daily. 11/23/17   Cathie HoopsYu, Amy V, PA-C  ?famotidine (PEPCID) 20 MG tablet Take 1 tablet (20 mg total) by mouth daily. 01/18/19   Samuel JesterMcManus, Kathleen, DO  ?hydrOXYzine (ATARAX/VISTARIL) 25 MG tablet Take 1-2 tablets (25-50 mg total) by mouth every 6 (six) hours as needed for anxiety. 02/24/20   Ward, Layla MawKristen N, DO  ?levETIRAcetam (KEPPRA) 500 MG tablet Take 2 tablets (1,000 mg total) by mouth 2 (two) times daily. 01/08/22   Jacalyn LefevreHaviland, Kathia Covington, MD  ?lisinopril (ZESTRIL) 10 MG tablet Take 1 tablet (10 mg total) by mouth daily. 01/08/22   Jacalyn LefevreHaviland, Monee Dembeck, MD  ?ondansetron (ZOFRAN ODT) 4 MG disintegrating tablet Take 1 tablet (4 mg total) by mouth every 8 (eight) hours as needed for nausea or vomiting. 11/23/17   Cathie HoopsYu, Amy V, PA-C  ?prednisoLONE acetate (PRED FORTE) 1 % ophthalmic suspension Place 2 drops  into both eyes daily.    [provider]  ?traMADol (ULTRAM) 50 MG tablet Take 1 tablet (50 mg total) by mouth every 6 (six) hours as needed. 02/10/18   Eyvonne MechanicHedges, Jeffrey, PA-C  ?   ? ?Allergies    ?Amoxicillin, Peanut-containing drug products, and Penicillins   ? ?Review of Systems   ?Review of Systems  ?Musculoskeletal:  Positive for neck pain.  ?Neurological:  Positive for headaches.  ?All other systems reviewed and are negative. ? ?Physical Exam ?Updated Vital Signs ?BP (!) 144/81   Pulse 84   Temp 98.7 ?F (37.1 ?C) (Oral)   Resp (!) 24   Ht 5\' 9"  (1.753 m)   Wt (!) 137.9 kg   SpO2 100%   BMI 44.89 kg/m?  ?Physical Exam ?Vitals and nursing note reviewed.  ?Constitutional:   ?   Appearance: Normal appearance. He is obese.  ?HENT:  ?   Head: Normocephalic and atraumatic.  ?   Right Ear: External ear normal.  ?   Left Ear: External ear normal.  ?   Nose: Nose normal.  ?   Comments: No oral trauma ?   Mouth/Throat:  ?   Mouth: Mucous membranes are dry.  ?Eyes:  ?   Extraocular Movements: Extraocular movements intact.  ?  Conjunctiva/sclera: Conjunctivae normal.  ?   Pupils: Pupils are equal, round, and reactive to light.  ?Cardiovascular:  ?   Rate and Rhythm: Normal rate and regular rhythm.  ?   Pulses: Normal pulses.  ?   Heart sounds: Normal heart sounds.  ?Pulmonary:  ?   Effort: Pulmonary effort is normal.  ?   Breath sounds: Normal breath sounds.  ?Abdominal:  ?   General: Abdomen is flat. Bowel sounds are normal.  ?   Palpations: Abdomen is soft.  ?Musculoskeletal:     ?   General: Normal range of motion.  ?   Cervical back: Normal range of motion and neck supple.  ?Skin: ?   General: Skin is warm.  ?   Capillary Refill: Capillary refill takes less than 2 seconds.  ?Neurological:  ?   General: No focal deficit present.  ?   Mental Status: He is alert and oriented to person, place, and time.  ?Psychiatric:     ?   Mood and Affect: Mood normal.     ?   Behavior: Behavior normal.     ?   Thought  Content: Thought content normal.  ? ? ?ED Results / Procedures / Treatments   ?Labs ?(all labs ordered are listed, but only abnormal results are displayed) ?Labs Reviewed  ?COMPREHENSIVE METABOLIC PANEL - Abnormal; Notable for the following components:  ?    Result Value  ? Glucose, Bld 137 (*)   ? All other components within normal limits  ?URINALYSIS, ROUTINE W REFLEX MICROSCOPIC - Abnormal; Notable for the following components:  ? Protein, ur 30 (*)   ? Bacteria, UA RARE (*)   ? All other components within normal limits  ?RAPID URINE DRUG SCREEN, HOSP PERFORMED - Abnormal; Notable for the following components:  ? Tetrahydrocannabinol POSITIVE (*)   ? All other components within normal limits  ?CBC WITH DIFFERENTIAL/PLATELET  ?MAGNESIUM  ?ETHANOL  ?CBG MONITORING, ED  ? ? ?EKG ?None ? ?Radiology ?CT Head Wo Contrast ? ?Result Date: 01/08/2022 ?CLINICAL DATA:  Head trauma, abnormal mental status (Age 73-64y); Neck trauma, dangerous injury mechanism (Age 58-64y) EXAM: CT HEAD WITHOUT CONTRAST CT CERVICAL SPINE WITHOUT CONTRAST TECHNIQUE: Multidetector CT imaging of the head and cervical spine was performed following the standard protocol without intravenous contrast. Multiplanar CT image reconstructions of the cervical spine were also generated. RADIATION DOSE REDUCTION: This exam was performed according to the departmental dose-optimization program which includes automated exposure control, adjustment of the mA and/or kV according to patient size and/or use of iterative reconstruction technique. COMPARISON:  None. FINDINGS: CT HEAD FINDINGS Brain: No evidence of large-territorial acute infarction. No parenchymal hemorrhage. No mass lesion. No extra-axial collection. No mass effect or midline shift. No hydrocephalus. Basilar cisterns are patent. Vascular: No hyperdense vessel. Skull: No acute fracture or focal lesion. Sinuses/Orbits: Paranasal sinuses and mastoid air cells are clear. The orbits are unremarkable.  Other: None. CT CERVICAL SPINE FINDINGS Alignment: Normal. Skull base and vertebrae: No acute fracture. No aggressive appearing focal osseous lesion or focal pathologic process. Soft tissues and spinal canal: No prevertebral fluid or swelling. No visible canal hematoma. Upper chest: Unremarkable. Other: None. IMPRESSION: 1. No acute intracranial abnormality. 2. No acute displaced fracture or traumatic listhesis of the cervical spine. Electronically Signed   By: Tish Frederickson M.D.   On: 01/08/2022 20:04  ? ?CT Cervical Spine Wo Contrast ? ?Result Date: 01/08/2022 ?CLINICAL DATA:  Head trauma, abnormal mental status (Age 101-64y); Neck trauma, dangerous  injury mechanism (Age 34-64y) EXAM: CT HEAD WITHOUT CONTRAST CT CERVICAL SPINE WITHOUT CONTRAST TECHNIQUE: Multidetector CT imaging of the head and cervical spine was performed following the standard protocol without intravenous contrast. Multiplanar CT image reconstructions of the cervical spine were also generated. RADIATION DOSE REDUCTION: This exam was performed according to the departmental dose-optimization program which includes automated exposure control, adjustment of the mA and/or kV according to patient size and/or use of iterative reconstruction technique. COMPARISON:  None. FINDINGS: CT HEAD FINDINGS Brain: No evidence of large-territorial acute infarction. No parenchymal hemorrhage. No mass lesion. No extra-axial collection. No mass effect or midline shift. No hydrocephalus. Basilar cisterns are patent. Vascular: No hyperdense vessel. Skull: No acute fracture or focal lesion. Sinuses/Orbits: Paranasal sinuses and mastoid air cells are clear. The orbits are unremarkable. Other: None. CT CERVICAL SPINE FINDINGS Alignment: Normal. Skull base and vertebrae: No acute fracture. No aggressive appearing focal osseous lesion or focal pathologic process. Soft tissues and spinal canal: No prevertebral fluid or swelling. No visible canal hematoma. Upper chest:  Unremarkable. Other: None. IMPRESSION: 1. No acute intracranial abnormality. 2. No acute displaced fracture or traumatic listhesis of the cervical spine. Electronically Signed   By: Tish Frederickson M.D.   On: 01/08/2022 20

## 2022-01-08 NOTE — ED Notes (Signed)
Pt transported to CT ?

## 2022-01-08 NOTE — ED Triage Notes (Signed)
Pt arrived via EMS from jail in police custody. Pt was found unresponsive in his cell. Pt was given narcan at the jail, 12mg . Pt woke up at this point. Pt has hx of seizures and appeared to be post-ictal at the time that he woke up. Pt has not had his keppra since being admitted to jail since 01/07/22. ?

## 2022-05-30 ENCOUNTER — Ambulatory Visit (HOSPITAL_COMMUNITY)
Admission: EM | Admit: 2022-05-30 | Discharge: 2022-05-30 | Disposition: A | Discharge status: Home / Self Care | Payer: No Typology Code available for payment source | Attending: Internal Medicine | Admitting: Internal Medicine

## 2022-05-30 ENCOUNTER — Encounter (HOSPITAL_COMMUNITY): Payer: Self-pay | Admitting: Emergency Medicine

## 2022-05-30 DIAGNOSIS — R300 Dysuria: Secondary | ICD-10-CM | POA: Insufficient documentation

## 2022-05-30 LAB — POCT URINALYSIS DIPSTICK, ED / UC
Glucose, UA: NEGATIVE mg/dL
Hgb urine dipstick: NEGATIVE
Nitrite: NEGATIVE
Protein, ur: 30 mg/dL — AB
Specific Gravity, Urine: 1.025 (ref 1.005–1.030)
Urobilinogen, UA: 4 mg/dL — ABNORMAL HIGH (ref 0.0–1.0)
pH: 6.5 (ref 5.0–8.0)

## 2022-05-30 LAB — HIV ANTIBODY (ROUTINE TESTING W REFLEX): HIV Screen 4th Generation wRfx: NONREACTIVE

## 2022-05-30 LAB — HEPATITIS C ANTIBODY: HCV Ab: NONREACTIVE

## 2022-05-30 MED ORDER — METRONIDAZOLE 500 MG PO TABS: 2000.0000 mg | ORAL_TABLET | Freq: Once | ORAL | 0 refills | Status: AC

## 2022-05-30 NOTE — ED Provider Notes (Signed)
[facility]   [id number] 05/30/22 Arrival Time: 0830  ASSESSMENT & PLAN:  1. Dysuria   -Patient with 1 week of dysuria.  No penile discharge or genital lesions.  He would like full STI panel, including blood test for HIV, RPR, hepatitis C.  We will call with results if positive.  Will send empiric rx for trichomonas since positive for that in December 2022. All questions answered.  Patient agrees to plan.   Meds ordered this encounter  Medications   metroNIDAZOLE (FLAGYL) 500 MG tablet    Sig: Take 4 tablets (2,000 mg total) by mouth once for 1 dose.    Dispense:  4 tablet    Refill:  0     Discharge Instructions      You were tested for STI today We will call you if results are positive Metronidazole has been sent to your pharmacy      Reviewed expectations re: course of current medical issues. Questions answered. Outlined signs and symptoms indicating need for more acute intervention. Patient verbalized understanding. After Visit Summary given.   SUBJECTIVE: Patient presents here to get evaluated for dysuria.  This been going on for about 1 week.  He says it feels like mild burning sensation when he urinates.  He denies any penile discharge.  He denies any rashes or lesions on the genitals.  He is sexually active with 1 male partner, his fiance.  They did not use any protection.  Their last sexual encounter was 2 days ago.  He has not taken any medications.  He denies any fevers, nausea/vomiting or abdominal pain. He does have history of trichomonas December 2022 and says he took abx as prescribed.  No LMP for male patient. History reviewed. No pertinent surgical history.   OBJECTIVE:  Vitals:   05/30/22 0844  BP: 137/88  Pulse: 95  Resp: 14  Temp: 98.1 F (36.7 C)  TempSrc: Oral  SpO2: 98%     Physical Exam Vitals reviewed.  Cardiovascular:     Rate and Rhythm: Normal rate.  Pulmonary:     Effort: Pulmonary effort is normal.  Abdominal:      Palpations: Abdomen is soft.     Tenderness: There is no abdominal tenderness. There is no right CVA tenderness, left CVA tenderness or guarding.  Genitourinary:    Penis: Normal.      Testes: Normal.  Skin:    General: Skin is warm and dry.  Neurological:     Mental Status: He is alert.      Labs: Results for orders placed or performed during the hospital encounter of 05/30/22  POC Urinalysis dipstick  Result Value Ref Range   Glucose, UA NEGATIVE NEGATIVE mg/dL   Bilirubin Urine SMALL (A) NEGATIVE   Ketones, ur TRACE (A) NEGATIVE mg/dL   Specific Gravity, Urine 1.025 1.005 - 1.030   Hgb urine dipstick NEGATIVE NEGATIVE   pH 6.5 5.0 - 8.0   Protein, ur 30 (A) NEGATIVE mg/dL   Urobilinogen, UA 4.0 (H) 0.0 - 1.0 mg/dL   Nitrite NEGATIVE NEGATIVE   Leukocytes,Ua SMALL (A) NEGATIVE   Labs Reviewed  POCT URINALYSIS DIPSTICK, ED / UC - Abnormal; Notable for the following components:      Result Value   Bilirubin Urine SMALL (*)    Ketones, ur TRACE (*)    Protein, ur 30 (*)    Urobilinogen, UA 4.0 (*)    Leukocytes,Ua SMALL (*)    All other components within normal limits  RPR  HIV ANTIBODY (ROUTINE TESTING W REFLEX)  HEPATITIS C ANTIBODY  CYTOLOGY, (ORAL, ANAL, URETHRAL) ANCILLARY ONLY    Imaging: No results found.   Allergies  Allergen Reactions   Amoxicillin Anaphylaxis   Peanut-Containing Drug Products Itching and Other (See Comments)    Tingly sensation in mouth   Penicillins Rash    Has patient had a PCN reaction causing immediate rash, facial/tongue/throat swelling, SOB or lightheadedness with hypotension: No Has patient had a PCN reaction causing severe rash involving mucus membranes or skin necrosis: No Has patient had a PCN reaction that required hospitalization No Has patient had a PCN reaction occurring within the last 10 years: Yes If all of the above answers are "NO", then may proceed with Cephalosporin use.                                                Past Medical History:  Diagnosis Date   Asthma    Head trauma    Hypertension    Seizure [facility])     Social History   Socioeconomic History   Marital status: Single    Spouse name: Not on file   Number of children: Not on file   Years of education: Not on file   Highest education level: Not on file  Occupational History   Not on file  Tobacco Use   Smoking status: Former    Packs/day: 0.25    Types: Cigarettes   Smokeless tobacco: Never  Vaping Use   Vaping Use: Never used  Substance and Sexual Activity   Alcohol use: Not Currently    Comment: "rarely"   Drug use: Not Currently   Sexual activity: Yes    Birth control/protection: None  Other Topics Concern   Not on file  Social History Narrative   ** Merged History Encounter **       Social Determinants of Health   Financial Resource Strain: Not on file  Food Insecurity: Not on file  Transportation Needs: Not on file  Physical Activity: Not on file  Stress: Not on file  Social Connections: Not on file  Intimate Partner Violence: Not on file    History reviewed. No pertinent family history.    Izabela Ow, Baldemar Friday, MD 05/30/22 [phone]

## 2022-05-30 NOTE — Discharge Instructions (Addendum)
You were tested for STI today We will call you if results are positive Metronidazole has been sent to your pharmacy

## 2022-05-30 NOTE — ED Triage Notes (Signed)
Patient c/o dysuria x 2 weeks.   Patient denies penile discharge, ABD pain, or testicular pain.   Patient denies any knowledge of STD exposure.   Patient wants STD screening with blood work.   Patient hasn't taken any medications for symptoms.

## 2022-05-31 LAB — CYTOLOGY, (ORAL, ANAL, URETHRAL) ANCILLARY ONLY
Chlamydia: NEGATIVE
Comment: NEGATIVE
Comment: NEGATIVE
Comment: NORMAL
Neisseria Gonorrhea: NEGATIVE
Trichomonas: POSITIVE — AB

## 2022-05-31 LAB — RPR: RPR Ser Ql: NONREACTIVE

## 2022-07-16 ENCOUNTER — Encounter (HOSPITAL_COMMUNITY): Payer: Self-pay

## 2022-07-16 ENCOUNTER — Ambulatory Visit (HOSPITAL_COMMUNITY)
Admission: EM | Admit: 2022-07-16 | Discharge: 2022-07-16 | Disposition: A | Discharge status: Home / Self Care | Payer: No Typology Code available for payment source | Attending: Emergency Medicine | Admitting: Emergency Medicine

## 2022-07-16 DIAGNOSIS — R369 Urethral discharge, unspecified: Secondary | ICD-10-CM

## 2022-07-16 DIAGNOSIS — Z202 Contact with and (suspected) exposure to infections with a predominantly sexual mode of transmission: Secondary | ICD-10-CM | POA: Diagnosis present

## 2022-07-16 NOTE — Discharge Instructions (Signed)
We will call you if anything on your swab returns positive. Please abstain from sexual intercourse until your results return. 

## 2022-07-16 NOTE — ED Provider Notes (Signed)
[facility]    CSN: [id number] Arrival date & time: 07/16/22  1002     History   Chief Complaint Chief Complaint  Patient presents with   SEXUALLY TRANSMITTED DISEASE    HPI Montavis Schubring is a 31 y.o. male.  Presents for 2-day history of dysuria and penile discharge Reports new partner recently, unknown exposure to STD  History of trichomonas but states this feels different No abdominal pain, rash, testicular pain or swelling  Past Medical History:  Diagnosis Date   Asthma    Head trauma    Hypertension    Seizure (HCC)     There are no problems to display for this patient.   History reviewed. No pertinent surgical history.     Home Medications    Prior to Admission medications   Medication Sig Start Date End Date Taking? Authorizing Provider  amLODipine (NORVASC) 2.5 MG tablet Take 1 tablet (2.5 mg total) by mouth daily. 01/08/22 02/07/22  Jacalyn Lefevre, MD  famotidine (PEPCID) 20 MG tablet Take 1 tablet (20 mg total) by mouth daily. 01/18/19   Samuel Jester, DO  levETIRAcetam (KEPPRA) 500 MG tablet Take 2 tablets (1,000 mg total) by mouth 2 (two) times daily. 01/08/22   Jacalyn Lefevre, MD  lisinopril (ZESTRIL) 10 MG tablet Take 1 tablet (10 mg total) by mouth daily. 01/08/22   Jacalyn Lefevre, MD  ondansetron (ZOFRAN ODT) 4 MG disintegrating tablet Take 1 tablet (4 mg total) by mouth every 8 (eight) hours as needed for nausea or vomiting. 11/23/17   Belinda Fisher, PA-C    Family History History reviewed. No pertinent family history.  Social History Social History   Tobacco Use   Smoking status: Former    Packs/day: 0.25    Types: Cigarettes   Smokeless tobacco: Never  Vaping Use   Vaping Use: Never used  Substance Use Topics   Alcohol use: Not Currently    Comment: "rarely"   Drug use: Not Currently     Allergies   Amoxicillin, Peanut-containing drug products, and Penicillins   Review of Systems Review of Systems  Per HPI Physical  Exam Triage Vital Signs ED Triage Vitals [07/16/22 1045]  Enc Vitals Group     BP (!) 137/96     Pulse Rate 65     Resp 18     Temp 98.6 F (37 C)     Temp Source Oral     SpO2 98 %     Weight      Height      Head Circumference      Peak Flow      Pain Score 0     Pain Loc      Pain Edu?      Excl. in GC?    No data found.  Updated Vital Signs BP (!) 137/96 (BP Location: Left Arm)   Pulse 65   Temp 98.6 F (37 C) (Oral)   Resp 18   SpO2 98%   Visual Acuity Right Eye Distance:   Left Eye Distance:   Bilateral Distance:    Right Eye Near:   Left Eye Near:    Bilateral Near:     Physical Exam Vitals and nursing note reviewed.  Constitutional:      General: He is not in acute distress.    Appearance: Normal appearance.  HENT:     Mouth/Throat:     Pharynx: Oropharynx is clear.  Cardiovascular:     Rate and Rhythm:  Normal rate and regular rhythm.     Pulses: Normal pulses.  Pulmonary:     Effort: Pulmonary effort is normal.  Neurological:     Mental Status: He is alert and oriented to person, place, and time.      UC Treatments / Results  Labs (all labs ordered are listed, but only abnormal results are displayed) Labs Reviewed  CYTOLOGY, (ORAL, ANAL, URETHRAL) ANCILLARY ONLY    EKG   Radiology No results found.  Procedures Procedures (including critical care time)  Medications Ordered in UC Medications - No data to display  Initial Impression / Assessment and Plan / UC Course  I have reviewed the triage vital signs and the nursing notes.  Pertinent labs & imaging results that were available during my care of the patient were reviewed by me and considered in my medical decision making (see chart for details).  Cyto swab pending. Discussed treatment as needed pending results. Provided STD pamphlet, discussed safe sex practices. Return precautions discussed. Patient agrees to plan  Final Clinical Impressions(s) / UC Diagnoses   Final  diagnoses:  Penile discharge  Possible exposure to STD     Discharge Instructions      We will call you if anything on your swab returns positive. Please abstain from sexual intercourse until your results return.    ED Prescriptions   None    PDMP not reviewed this encounter.   Jeancarlo Leffler, Vernice Jefferson 07/16/22 1149

## 2022-07-16 NOTE — ED Triage Notes (Signed)
Pt c/o burning on urination x2-3 days and penile discharge since yesterday. States possible exposure to trich.

## 2022-07-17 LAB — CYTOLOGY, (ORAL, ANAL, URETHRAL) ANCILLARY ONLY
Chlamydia: NEGATIVE
Comment: NEGATIVE
Comment: NEGATIVE
Comment: NORMAL
Neisseria Gonorrhea: NEGATIVE
Trichomonas: NEGATIVE

## 2022-08-31 ENCOUNTER — Ambulatory Visit (HOSPITAL_COMMUNITY)
Admission: EM | Admit: 2022-08-31 | Discharge: 2022-08-31 | Disposition: A | Discharge status: Home / Self Care | Payer: Commercial Managed Care - HMO | Attending: Emergency Medicine | Admitting: Emergency Medicine

## 2022-08-31 ENCOUNTER — Encounter (HOSPITAL_COMMUNITY): Payer: Self-pay | Admitting: Emergency Medicine

## 2022-08-31 DIAGNOSIS — Z113 Encounter for screening for infections with a predominantly sexual mode of transmission: Secondary | ICD-10-CM

## 2022-08-31 DIAGNOSIS — J029 Acute pharyngitis, unspecified: Secondary | ICD-10-CM | POA: Diagnosis not present

## 2022-08-31 DIAGNOSIS — R300 Dysuria: Secondary | ICD-10-CM | POA: Diagnosis present

## 2022-08-31 LAB — POCT URINALYSIS DIPSTICK, ED / UC
Bilirubin Urine: NEGATIVE
Glucose, UA: NEGATIVE mg/dL
Hgb urine dipstick: NEGATIVE
Ketones, ur: NEGATIVE mg/dL
Leukocytes,Ua: NEGATIVE
Nitrite: NEGATIVE
Protein, ur: NEGATIVE mg/dL
Specific Gravity, Urine: >=1.03 (ref 1.005–1.030)
Urobilinogen, UA: 4 mg/dL — ABNORMAL HIGH (ref 0.0–1.0)
pH: 6 (ref 5.0–8.0)

## 2022-08-31 LAB — POCT RAPID STREP A, ED / UC: Streptococcus, Group A Screen (Direct): NEGATIVE

## 2022-08-31 LAB — HIV ANTIBODY (ROUTINE TESTING W REFLEX): HIV Screen 4th Generation wRfx: NONREACTIVE

## 2022-08-31 LAB — HEPATITIS C ANTIBODY: HCV Ab: NONREACTIVE

## 2022-08-31 NOTE — Discharge Instructions (Addendum)
We will call you if any of your test results are positive. You can review all these test results on MyChart.   At this time refrain from any sexual activity until we receive all your test results.   Please make sure to be drinking plenty of water, at least 8 cups of water daily.

## 2022-08-31 NOTE — ED Triage Notes (Signed)
Pt requesting STD testing. Repots still has some discharge that had when seen here in October.

## 2022-08-31 NOTE — ED Provider Notes (Signed)
[facility]    CSN: [id number] Arrival date & time: 08/31/22  1530      History   Chief Complaint Chief Complaint  Patient presents with   Exposure to STD    HPI Spencer Davis is a 31 y.o. male.  Patient presents complaining of intermittent dysuria that has been ongoing for the past month.  Patient reports being seen at this clinic in October and having penile discharge, he states that all his test results came back negative and penile discharge has now resolved.  Patient denies any penile pain, testicular pain, or abdominal pain.  Patient reports having the same 2 male sexual partners without condom use in the past 3 months.  Patient denies any known STD exposure.   Patient complains of a sore throat that started 4 days ago.  Patient reports onset of symptoms began with chills and nasal congestion that has now resolved.  Patient reports ongoing painful swallowing.  Patient reports concern for STD within throat due to oral sexual encounters with his 2 male partners.  Patient denies any known exposure to strep.  Patient has not taken any medications for symptoms.      Exposure to STD Pertinent negatives include no abdominal pain.    Past Medical History:  Diagnosis Date   Asthma    Head trauma    Hypertension    Seizure (HCC)     There are no problems to display for this patient.   History reviewed. No pertinent surgical history.     Home Medications    Prior to Admission medications   Medication Sig Start Date End Date Taking? Authorizing Provider  amLODipine (NORVASC) 2.5 MG tablet Take 1 tablet (2.5 mg total) by mouth daily. 01/08/22 02/07/22  Jacalyn Lefevre, MD  levETIRAcetam (KEPPRA) 500 MG tablet Take 2 tablets (1,000 mg total) by mouth 2 (two) times daily. 01/08/22   Jacalyn Lefevre, MD  lisinopril (ZESTRIL) 10 MG tablet Take 1 tablet (10 mg total) by mouth daily. 01/08/22   Jacalyn Lefevre, MD    Family History No family history on  file.  Social History Social History   Tobacco Use   Smoking status: Former    Packs/day: 0.25    Types: Cigarettes   Smokeless tobacco: Never  Vaping Use   Vaping Use: Never used  Substance Use Topics   Alcohol use: Not Currently    Comment: "rarely"   Drug use: Not Currently     Allergies   Amoxicillin, Peanut-containing drug products, and Penicillins   Review of Systems Review of Systems  Constitutional:  Negative for activity change, appetite change, chills, fatigue and fever.  Gastrointestinal:  Negative for abdominal distention, abdominal pain, diarrhea, nausea and vomiting.  Genitourinary:  Positive for dysuria. Negative for decreased urine volume, difficulty urinating, enuresis, flank pain, frequency, genital sores, hematuria, penile discharge, penile pain, penile swelling, scrotal swelling, testicular pain and urgency.     Physical Exam Triage Vital Signs ED Triage Vitals  Enc Vitals Group     BP 08/31/22 1652 123/85     Pulse Rate 08/31/22 1652 88     Resp 08/31/22 1652 17     Temp 08/31/22 1652 98 F (36.7 C)     Temp Source 08/31/22 1652 Oral     SpO2 08/31/22 1652 97 %     Weight --      Height --      Head Circumference --      Peak Flow --  Pain Score 08/31/22 1651 0     Pain Loc --      Pain Edu? --      Excl. in [place]? --    No data found.  Updated Vital Signs BP 123/85 (BP Location: Right Arm)   Pulse 88   Temp 98 F (36.7 C) (Oral)   Resp 17   SpO2 97%      Physical Exam Vitals and nursing note reviewed.  Constitutional:      Appearance: Normal appearance.  HENT:     Mouth/Throat:     Mouth: Mucous membranes are moist.     Tongue: No lesions.     Pharynx: Posterior oropharyngeal erythema and uvula swelling present. No pharyngeal swelling or oropharyngeal exudate.     Tonsils: No tonsillar exudate or tonsillar abscesses. 0 on the right. 0 on the left.  Genitourinary:    Comments: Deferred exam Neurological:     Mental  Status: He is alert.      UC Treatments / Results  Labs (all labs ordered are listed, but only abnormal results are displayed) Labs Reviewed  POCT URINALYSIS DIPSTICK, ED / UC - Abnormal; Notable for the following components:      Result Value   Urobilinogen, UA 4.0 (*)    All other components within normal limits  HIV ANTIBODY (ROUTINE TESTING W REFLEX)  RPR  HEPATITIS C ANTIBODY  POCT RAPID STREP A, ED / UC  CYTOLOGY, (ORAL, ANAL, URETHRAL) ANCILLARY ONLY  CYTOLOGY, (ORAL, ANAL, URETHRAL) ANCILLARY ONLY    EKG   Radiology No results found.  Procedures Procedures (including critical care time)  Medications Ordered in UC Medications - No data to display  Initial Impression / Assessment and Plan / UC Course  I have reviewed the triage vital signs and the nursing notes.  Pertinent labs & imaging results that were available during my care of the patient were reviewed by me and considered in my medical decision making (see chart for details).     Patient was evaluated for dysuria and STD screening .  Urinalysis showed 4.0 urobilinogen, otherwise unremarkable, no explanation for intermittent dysuria. Cytology swab is pending . HIV, RPR, and Hep C are pending. Hep C performed upon request from patient. This Clinical research associate discussed with patient why HSV testing doesn't appear necessary at this time due to no symptoms related to HSV and inaccuracy of blood testing. Patient verbalized understanding of education. Higher likelihood of dysuria being related to a possible STD especially since patient has 2 current sexual partners without condom use.  Awaiting test results before starting antimicrobial treatment. This Clinical research associate was unable to educate patient on refraining from sexual intercourse until results came back because the patient left before discharge.   Patient was evaluated for a sore throat.  POC strep was negative.  Based on symptomology and assessment findings, etiology appears to be  related to viral illness.  Patient left clinic before this writer could discuss result findings with patient and this writer was unable to make patient aware of over-the-counter symptom management.   Final Clinical Impressions(s) / UC Diagnoses   Final diagnoses:  Dysuria  Viral pharyngitis  Screening examination for STD (sexually transmitted disease)     Discharge Instructions      We will call you if any of your test results are positive. You can review all these test results on MyChart.   At this time refrain from any sexual activity until we receive all your test results.   Please  make sure to be drinking plenty of water, at least 8 cups of water daily.      ED Prescriptions   None    PDMP not reviewed this encounter.   Debby Freiberg, NP 08/31/22 [phone]

## 2022-09-01 LAB — RPR: RPR Ser Ql: NONREACTIVE

## 2022-09-03 LAB — CYTOLOGY, (ORAL, ANAL, URETHRAL) ANCILLARY ONLY
Chlamydia: NEGATIVE
Comment: NEGATIVE
Comment: NEGATIVE
Comment: NORMAL
Neisseria Gonorrhea: NEGATIVE
Trichomonas: NEGATIVE

## 2022-09-04 LAB — CYTOLOGY, (ORAL, ANAL, URETHRAL) ANCILLARY ONLY
Chlamydia: NEGATIVE
Comment: NEGATIVE
Comment: NEGATIVE
Comment: NORMAL
Neisseria Gonorrhea: NEGATIVE
Trichomonas: POSITIVE — AB

## 2022-12-13 DIAGNOSIS — N342 Other urethritis: Secondary | ICD-10-CM | POA: Diagnosis not present

## 2022-12-13 DIAGNOSIS — Z202 Contact with and (suspected) exposure to infections with a predominantly sexual mode of transmission: Secondary | ICD-10-CM | POA: Diagnosis not present

## 2022-12-13 DIAGNOSIS — Z113 Encounter for screening for infections with a predominantly sexual mode of transmission: Secondary | ICD-10-CM | POA: Diagnosis not present

## 2022-12-17 ENCOUNTER — Other Ambulatory Visit: Payer: Self-pay

## 2022-12-17 ENCOUNTER — Ambulatory Visit (HOSPITAL_COMMUNITY)
Admission: EM | Admit: 2022-12-17 | Discharge: 2022-12-17 | Disposition: A | Discharge status: Home / Self Care | Payer: 59 | Attending: Family | Admitting: Family

## 2022-12-17 ENCOUNTER — Encounter (HOSPITAL_COMMUNITY): Payer: Self-pay | Admitting: Emergency Medicine

## 2022-12-17 DIAGNOSIS — Z113 Encounter for screening for infections with a predominantly sexual mode of transmission: Secondary | ICD-10-CM | POA: Diagnosis not present

## 2022-12-17 DIAGNOSIS — Z202 Contact with and (suspected) exposure to infections with a predominantly sexual mode of transmission: Secondary | ICD-10-CM | POA: Diagnosis not present

## 2022-12-17 MED ORDER — METRONIDAZOLE 500 MG PO TABS: 2000.0000 mg | ORAL_TABLET | Freq: Once | ORAL | 0 refills | Status: AC

## 2022-12-17 NOTE — Discharge Instructions (Addendum)
Take Metronidazole '500mg'$  tablets- take all 4 at once with food. No sexual intercourse for at least 7 days. Follow-up pending lab results.

## 2022-12-17 NOTE — ED Triage Notes (Signed)
Partner diagnosed with trich.  Denies any symptoms

## 2022-12-17 NOTE — ED Provider Notes (Signed)
[facility]    CSN: [id number] Arrival date & time: 12/17/22  1000      History   Chief Complaint Chief Complaint  Patient presents with   SEXUALLY TRANSMITTED DISEASE    HPI Spencer Davis is a 32 y.o. male.   32 year old male accompanied by his male partner presents with exposure to Trichomonas . His partner just found out today that she is positive for Trich, negative for GC/Chlamydia. He presents for treatment. Does not use condoms. He denies any dysuria, hematuria, penile discharge or rash. He has noticed slight increased urinary frequency but no back or abdominal pain. He has tested positive for Trich a few months ago and is familiar with treatment. Requests routine screening for STD's. Declines blood work. Other chronic health issues include HTN and history of seizures. Currently on Lisinopril and Keppra daily.   The history is provided by the patient.    Past Medical History:  Diagnosis Date   Asthma    Head trauma    Hypertension    Seizure ([place])     There are no problems to display for this patient.   History reviewed. No pertinent surgical history.     Home Medications    Prior to Admission medications   Medication Sig Start Date End Date Taking? Authorizing Provider  metroNIDAZOLE (FLAGYL) 500 MG tablet Take 4 tablets (2,000 mg total) by mouth once for 1 dose. 12/17/22 12/17/22 Yes Calene Paradiso, Nicholes Stairs, NP  amLODipine (NORVASC) 2.5 MG tablet Take 1 tablet (2.5 mg total) by mouth daily. 01/08/22 02/07/22  Isla Pence, MD  levETIRAcetam (KEPPRA) 500 MG tablet Take 2 tablets (1,000 mg total) by mouth 2 (two) times daily. 01/08/22   Isla Pence, MD  lisinopril (ZESTRIL) 10 MG tablet Take 1 tablet (10 mg total) by mouth daily. 01/08/22   Isla Pence, MD    Family History History reviewed. No pertinent family history.  Social History Social History   Tobacco Use   Smoking status: Former    Packs/day: 0.25    Types: Cigarettes   Smokeless  tobacco: Never  Vaping Use   Vaping Use: Former  Substance Use Topics   Alcohol use: Not Currently    Comment: "rarely"   Drug use: Yes    Types: Marijuana     Allergies   Amoxicillin, Peanut-containing drug products, and Penicillins   Review of Systems Review of Systems  Constitutional:  Negative for activity change, appetite change, chills, fatigue and fever.  HENT:  Negative for mouth sores, sore throat and trouble swallowing.   Gastrointestinal:  Negative for abdominal pain, nausea and vomiting.  Genitourinary:  Positive for frequency. Negative for decreased urine volume, difficulty urinating, dysuria, flank pain, genital sores, hematuria, penile discharge, penile pain, penile swelling, scrotal swelling, testicular pain and urgency.  Musculoskeletal:  Negative for back pain.  Skin:  Negative for color change and rash.  Allergic/Immunologic: Negative for environmental allergies, food allergies and immunocompromised state.  Neurological:  Positive for seizures (controlled with Keppra). Negative for dizziness, tremors, syncope, light-headedness and headaches.  Hematological:  Negative for adenopathy. Does not bruise/bleed easily.     Physical Exam Triage Vital Signs ED Triage Vitals  Enc Vitals Group     BP 12/17/22 1050 138/89     Pulse Rate 12/17/22 1050 74     Resp 12/17/22 1050 20     Temp 12/17/22 1050 97.7 F (36.5 C)     Temp Source 12/17/22 1050 Oral  SpO2 12/17/22 1050 97 %     Weight --      Height --      Head Circumference --      Peak Flow --      Pain Score 12/17/22 1048 0     Pain Loc --      Pain Edu? --      Excl. in [place]? --    No data found.  Updated Vital Signs BP 138/89 (BP Location: Left Arm) Comment (BP Location): large cuff  Pulse 74   Temp 97.7 F (36.5 C) (Oral)   Resp 20   SpO2 97%   Visual Acuity Right Eye Distance:   Left Eye Distance:   Bilateral Distance:    Right Eye Near:   Left Eye Near:    Bilateral Near:      Physical Exam Vitals and nursing note reviewed.  Constitutional:      General: He is awake. He is not in acute distress.    Appearance: He is well-developed and well-groomed.     Comments: He is sitting comfortably on the exam table in no acute distress.   HENT:     Head: Normocephalic and atraumatic.     Right Ear: Hearing normal.     Left Ear: Hearing normal.  Cardiovascular:     Rate and Rhythm: Normal rate.  Pulmonary:     Effort: Pulmonary effort is normal.  Abdominal:     General: Abdomen is flat.     Palpations: Abdomen is soft.     Tenderness: There is abdominal tenderness in the left lower quadrant. There is no right CVA tenderness, left CVA tenderness, guarding or rebound.     Comments: Slight discomfort with palpation of left lower quadrant.   Genitourinary:    Comments: Patient performed self-swab for STD testing. No genital exam performed.  Skin:    General: Skin is warm and dry.     Findings: No rash.  Neurological:     General: No focal deficit present.     Mental Status: He is alert and oriented to person, place, and time.  Psychiatric:        Mood and Affect: Mood normal.        Behavior: Behavior normal. Behavior is cooperative.      UC Treatments / Results  Labs (all labs ordered are listed, but only abnormal results are displayed) Labs Reviewed  CYTOLOGY, (ORAL, ANAL, URETHRAL) ANCILLARY ONLY    EKG   Radiology No results found.  Procedures Procedures (including critical care time)  Medications Ordered in UC Medications - No data to display  Initial Impression / Assessment and Plan / UC Course  I have reviewed the triage vital signs and the nursing notes.  Pertinent labs & imaging results that were available during my care of the patient were reviewed by me and considered in my medical decision making (see chart for details).     Since his partner has accompanied the patient and has just tested positive for Trichomonas, will treat  for probable asymptomatic Trichomonas infection. Since he is not having any other symptoms, will provide Rx for 1 day treatment instead of 7 days per Epocrates medication treatment guideline. Take Flagyl 2g once today with food. No alcohol. Reviewed again, no sexual intercourse for at least 7 days. Encouraged to use condoms with each and every future sexual encounter. Will prescribe additional medication if needed pending lab results.    Final Clinical Impressions(s) / UC Diagnoses  Final diagnoses:  Exposure to trichomonas  Screening for STD (sexually transmitted disease)     Discharge Instructions      Take Metronidazole '500mg'$  tablets- take all 4 at once with food. No sexual intercourse for at least 7 days. Follow-up pending lab results.     ED Prescriptions     Medication Sig Dispense Auth. Provider   metroNIDAZOLE (FLAGYL) 500 MG tablet Take 4 tablets (2,000 mg total) by mouth once for 1 dose. 4 tablet Jaquelyne Firkus, Nicholes Stairs, NP      PDMP not reviewed this encounter.   Katy Apo, NP 12/17/22 1939

## 2022-12-18 LAB — CYTOLOGY, (ORAL, ANAL, URETHRAL) ANCILLARY ONLY
Chlamydia: NEGATIVE
Comment: NEGATIVE
Comment: NEGATIVE
Comment: NORMAL
Neisseria Gonorrhea: NEGATIVE
Trichomonas: POSITIVE — AB

## 2022-12-25 ENCOUNTER — Encounter (HOSPITAL_COMMUNITY): Payer: Self-pay

## 2022-12-25 ENCOUNTER — Other Ambulatory Visit: Payer: Self-pay

## 2022-12-25 ENCOUNTER — Ambulatory Visit (HOSPITAL_COMMUNITY)
Admission: EM | Admit: 2022-12-25 | Discharge: 2022-12-25 | Disposition: A | Discharge status: Home / Self Care | Payer: 59 | Attending: Family Medicine | Admitting: Family Medicine

## 2022-12-25 ENCOUNTER — Ambulatory Visit: Admit: 2022-12-25 | Discharge status: Against Medical Advice | Payer: 59

## 2022-12-25 DIAGNOSIS — Z202 Contact with and (suspected) exposure to infections with a predominantly sexual mode of transmission: Secondary | ICD-10-CM | POA: Insufficient documentation

## 2022-12-25 NOTE — ED Provider Notes (Signed)
[facility]    CSN: [id number] Arrival date & time: 12/25/22  [id number]      History   Chief Complaint Chief Complaint  Patient presents with   SEXUALLY TRANSMITTED DISEASE    HPI Spencer Davis is a 32 y.o. male.   HPI 32 year old male presents for STD check.  Reports he was treated for trichomonas on 12/17/2022 and wants to be rechecked.  Patient denies any current symptoms.  PMH significant for morbid obesity, HTN and seizure.  Past Medical History:  Diagnosis Date   Asthma    Head trauma    Hypertension    Seizure ([place])     There are no problems to display for this patient.   History reviewed. No pertinent surgical history.     Home Medications    Prior to Admission medications   Medication Sig Start Date End Date Taking? Authorizing Provider  amLODipine (NORVASC) 2.5 MG tablet Take 1 tablet (2.5 mg total) by mouth daily. 01/08/22 02/07/22  Isla Pence, MD  levETIRAcetam (KEPPRA) 500 MG tablet Take 2 tablets (1,000 mg total) by mouth 2 (two) times daily. 01/08/22   Isla Pence, MD  lisinopril (ZESTRIL) 10 MG tablet Take 1 tablet (10 mg total) by mouth daily. 01/08/22   Isla Pence, MD    Family History History reviewed. No pertinent family history.  Social History Social History   Tobacco Use   Smoking status: Former    Packs/day: 0.25    Types: Cigarettes   Smokeless tobacco: Never  Vaping Use   Vaping Use: Former  Substance Use Topics   Alcohol use: Not Currently    Comment: "rarely"   Drug use: Yes    Types: Marijuana     Allergies   Amoxicillin, Peanut-containing drug products, and Penicillins   Review of Systems Review of Systems  All other systems reviewed and are negative.    Physical Exam Triage Vital Signs ED Triage Vitals  Enc Vitals Group     BP 12/25/22 1045 122/85     Pulse Rate 12/25/22 1045 66     Resp 12/25/22 1045 18     Temp 12/25/22 1045 98 F (36.7 C)     Temp Source 12/25/22 1045 Oral     SpO2  12/25/22 1045 96 %     Weight --      Height --      Head Circumference --      Peak Flow --      Pain Score 12/25/22 1046 0     Pain Loc --      Pain Edu? --      Excl. in [place]? --    No data found.  Updated Vital Signs BP 122/85 (BP Location: Left Arm)   Pulse 66   Temp 98 F (36.7 C) (Oral)   Resp 18   SpO2 96%      Physical Exam Vitals and nursing note reviewed.  Constitutional:      Appearance: Normal appearance. He is obese.  HENT:     Head: Normocephalic and atraumatic.     Mouth/Throat:     Mouth: Mucous membranes are moist.     Pharynx: Oropharynx is clear.  Eyes:     Extraocular Movements: Extraocular movements intact.     Conjunctiva/sclera: Conjunctivae normal.     Pupils: Pupils are equal, round, and reactive to light.  Cardiovascular:     Rate and Rhythm: Normal rate and regular rhythm.     Pulses: Normal pulses.  Heart sounds: Normal heart sounds.  Pulmonary:     Effort: Pulmonary effort is normal.     Breath sounds: Normal breath sounds. No wheezing, rhonchi or rales.  Musculoskeletal:        General: Normal range of motion.     Cervical back: Normal range of motion and neck supple.  Skin:    General: Skin is warm and dry.  Neurological:     General: No focal deficit present.     Mental Status: He is alert and oriented to person, place, and time. Mental status is at baseline.      UC Treatments / Results  Labs (all labs ordered are listed, but only abnormal results are displayed) Labs Reviewed  CYTOLOGY, (ORAL, ANAL, URETHRAL) ANCILLARY ONLY    EKG   Radiology No results found.  Procedures Procedures (including critical care time)  Medications Ordered in UC Medications - No data to display  Initial Impression / Assessment and Plan / UC Course  I have reviewed the triage vital signs and the nursing notes.  Pertinent labs & imaging results that were available during my care of the patient were reviewed by me and considered in  my medical decision making (see chart for details).     MDM: 1. Potential exposure to STD-[facility] swab ordered per patient request. Advised patient we will follow-up with [facility] swab results once received. Final Clinical Impressions(s) / UC Diagnoses   Final diagnoses:  Potential exposure to STD     Discharge Instructions      Advised patient we will follow-up with [facility] swab results once received.     ED Prescriptions   None    PDMP not reviewed this encounter.   Eliezer Lofts, Dwight 12/25/22 1149

## 2022-12-25 NOTE — ED Triage Notes (Signed)
Pt states tx'd for trich on 3/4 and wants a recheck. Denies sx'd

## 2022-12-25 NOTE — Discharge Instructions (Addendum)
Advised patient we will follow-up with [facility] swab results once received.

## 2022-12-26 LAB — CYTOLOGY, (ORAL, ANAL, URETHRAL) ANCILLARY ONLY
Chlamydia: NEGATIVE
Comment: NEGATIVE
Comment: NEGATIVE
Comment: NORMAL
Neisseria Gonorrhea: NEGATIVE
Trichomonas: NEGATIVE

## 2023-01-25 DIAGNOSIS — Z31440 Encounter of male for testing for genetic disease carrier status for procreative management: Secondary | ICD-10-CM | POA: Diagnosis not present

## 2023-01-29 ENCOUNTER — Encounter (HOSPITAL_COMMUNITY): Payer: Self-pay

## 2023-01-29 ENCOUNTER — Emergency Department (HOSPITAL_COMMUNITY)
Admission: EM | Admit: 2023-01-29 | Discharge: 2023-01-29 | Disposition: A | Discharge status: Home / Self Care | Payer: 59 | Attending: Emergency Medicine | Admitting: Emergency Medicine

## 2023-01-29 DIAGNOSIS — R200 Anesthesia of skin: Secondary | ICD-10-CM

## 2023-01-29 DIAGNOSIS — Z79899 Other long term (current) drug therapy: Secondary | ICD-10-CM | POA: Diagnosis not present

## 2023-01-29 DIAGNOSIS — J45909 Unspecified asthma, uncomplicated: Secondary | ICD-10-CM | POA: Diagnosis not present

## 2023-01-29 DIAGNOSIS — Z91010 Allergy to peanuts: Secondary | ICD-10-CM | POA: Insufficient documentation

## 2023-01-29 DIAGNOSIS — I10 Essential (primary) hypertension: Secondary | ICD-10-CM | POA: Insufficient documentation

## 2023-01-29 DIAGNOSIS — M25532 Pain in left wrist: Secondary | ICD-10-CM | POA: Insufficient documentation

## 2023-01-29 DIAGNOSIS — R202 Paresthesia of skin: Secondary | ICD-10-CM

## 2023-01-29 LAB — DIFFERENTIAL
Abs Immature Granulocytes: 0.02 10*3/uL (ref 0.00–0.07)
Basophils Absolute: 0 10*3/uL (ref 0.0–0.1)
Basophils Relative: 0 %
Eosinophils Absolute: 0.1 10*3/uL (ref 0.0–0.5)
Eosinophils Relative: 1 %
Immature Granulocytes: 0 %
Lymphocytes Relative: 29 %
Lymphs Abs: 2.7 10*3/uL (ref 0.7–4.0)
Monocytes Absolute: 0.6 10*3/uL (ref 0.1–1.0)
Monocytes Relative: 7 %
Neutro Abs: 5.7 10*3/uL (ref 1.7–7.7)
Neutrophils Relative %: 63 %

## 2023-01-29 LAB — ETHANOL: Alcohol, Ethyl (B): <10 mg/dL (ref <10)

## 2023-01-29 LAB — COMPREHENSIVE METABOLIC PANEL
ALT: 23 U/L (ref 0–44)
AST: 18 U/L (ref 15–41)
Albumin: 4.5 g/dL (ref 3.5–5.0)
Alkaline Phosphatase: 70 U/L (ref 38–126)
Anion gap: 10 (ref 5–15)
BUN: 15 mg/dL (ref 6–20)
CO2: 24 mmol/L (ref 22–32)
Calcium: 9.2 mg/dL (ref 8.9–10.3)
Chloride: 101 mmol/L (ref 98–111)
Creatinine, Ser: 0.96 mg/dL (ref 0.61–1.24)
GFR, Estimated: >60 mL/min (ref >60)
Glucose, Bld: 108 mg/dL — ABNORMAL HIGH (ref 70–99)
Potassium: 3.8 mmol/L (ref 3.5–5.1)
Sodium: 135 mmol/L (ref 135–145)
Total Bilirubin: 0.9 mg/dL (ref 0.3–1.2)
Total Protein: 8.1 g/dL (ref 6.5–8.1)

## 2023-01-29 LAB — CBC
HCT: 46.9 % (ref 39.0–52.0)
Hemoglobin: 16.2 g/dL (ref 13.0–17.0)
MCH: 30.3 pg (ref 26.0–34.0)
MCHC: 34.5 g/dL (ref 30.0–36.0)
MCV: 87.8 fL (ref 80.0–100.0)
Platelets: 267 10*3/uL (ref 150–400)
RBC: 5.34 MIL/uL (ref 4.22–5.81)
RDW: 12.6 % (ref 11.5–15.5)
WBC: 9.1 10*3/uL (ref 4.0–10.5)
nRBC: 0 % (ref 0.0–0.2)

## 2023-01-29 MED ORDER — SODIUM CHLORIDE 0.9% FLUSH: 3.0000 mL | Freq: Once | INTRAVENOUS | Status: DC

## 2023-01-29 NOTE — ED Triage Notes (Signed)
Pt states that he has been having L hand numbness for the past 2 weeks, on and off, today is the worst.

## 2023-01-29 NOTE — ED Provider Notes (Signed)
[place] EMERGENCY DEPARTMENT AT [facility] Provider Note  CSN: [id number] Arrival date & time: 01/29/23 0139  Chief Complaint(s) Numbness  HPI Spencer Davis is a 32 y.o. male here with left thenar/thumb pain with numbness to index and middle finger   The history is provided by the patient.  Hand Pain This is a new problem. Episode onset: 2 weeks. The problem occurs constantly. Progression since onset: fluctuating. Pertinent negatives include no chest pain, no abdominal pain, no headaches and no shortness of breath. Exacerbated by: palpation, ROM. Nothing relieves the symptoms. He has tried nothing for the symptoms.   Works with floor buffer.  Past Medical History Past Medical History:  Diagnosis Date   Asthma    Head trauma    Hypertension    Seizure    There are no problems to display for this patient.  Home Medication(s) Prior to Admission medications   Medication Sig Start Date End Date Taking? Authorizing Provider  amLODipine (NORVASC) 2.5 MG tablet Take 1 tablet (2.5 mg total) by mouth daily. 01/08/22 02/07/22  Jacalyn Lefevre, MD  levETIRAcetam (KEPPRA) 500 MG tablet Take 2 tablets (1,000 mg total) by mouth 2 (two) times daily. 01/08/22   Jacalyn Lefevre, MD  lisinopril (ZESTRIL) 10 MG tablet Take 1 tablet (10 mg total) by mouth daily. 01/08/22   Jacalyn Lefevre, MD                                                                                                                                    Allergies Amoxicillin, Peanut-containing drug products, and Penicillins  Review of Systems Review of Systems  Respiratory:  Negative for shortness of breath.   Cardiovascular:  Negative for chest pain.  Gastrointestinal:  Negative for abdominal pain.  Neurological:  Negative for headaches.   As noted in HPI  Physical Exam Vital Signs  I have reviewed the triage vital signs BP (!) 137/104   Pulse (!) 106   Temp 98.2 F (36.8 C) (Oral)   Resp 20   SpO2 100%    Physical Exam Vitals reviewed.  Constitutional:      General: He is not in acute distress.    Appearance: He is well-developed. He is not diaphoretic.  HENT:     Head: Normocephalic and atraumatic.     Right Ear: External ear normal.     Left Ear: External ear normal.     Nose: Nose normal.     Mouth/Throat:     Mouth: Mucous membranes are moist.  Eyes:     General: No scleral icterus.    Conjunctiva/sclera: Conjunctivae normal.  Neck:     Trachea: Phonation normal.  Cardiovascular:     Rate and Rhythm: Normal rate and regular rhythm.  Pulmonary:     Effort: Pulmonary effort is normal. No respiratory distress.     Breath sounds: No stridor.  Abdominal:     General: There is  no distension.  Musculoskeletal:        General: Normal range of motion.     Right hand: No swelling. Normal range of motion. Normal strength. Normal sensation. Normal pulse.     Left hand: Tenderness present. No swelling. Normal range of motion. Normal strength. Decreased sensation of the median distribution. Normal pulse.       Hands:     Cervical back: Normal range of motion.  Neurological:     Mental Status: He is alert and oriented to person, place, and time.     Cranial Nerves: Cranial nerves 2-12 are intact.     Motor: Motor function is intact. No abnormal muscle tone.     Comments: Decreased light touch to left index and middle finger distal to  PIP. Intact pinprick. Strength (grip, and individual fingers) intact.  Psychiatric:        Behavior: Behavior normal.     ED Results and Treatments Labs (all labs ordered are listed, but only abnormal results are displayed) Labs Reviewed  COMPREHENSIVE METABOLIC PANEL - Abnormal; Notable for the following components:      Result Value   Glucose, Bld 108 (*)    All other components within normal limits  CBC  ETHANOL  DIFFERENTIAL                                                                                                                          EKG  EKG Interpretation  Date/Time:    Ventricular Rate:    PR Interval:    QRS Duration:   QT Interval:    QTC Calculation:   R Axis:     Text Interpretation:         Radiology No results found.  Medications Ordered in ED Medications  sodium chloride flush (NS) 0.9 % injection 3 mL (3 mLs Intravenous Not Given 01/29/23 0212)                                                                                                                                     Procedures Procedures  (including critical care time)  Medical Decision Making / ED Course  Click here for ABCD2, HEART and other calculators  Medical Decision Making Amount and/or Complexity of Data Reviewed Labs: ordered.    Presentation most suspicious for carpal tunnel syndrome. Less concern for central process or more proximal neuropathy. Labs were  drawn in triage: CBC without leukocytosis or anemia. Metabolic panel without significant electrolyte derangements or renal sufficiency. No indication for imaging at this time.  Splint provided. Carpal tunnel info given Ortho follow recommended.       Final Clinical Impression(s) / ED Diagnoses Final diagnoses:  Left wrist pain  Numbness  Paresthesias   The patient appears reasonably screened and/or stabilized for discharge and I doubt any other medical condition or other [facility] requiring further screening, evaluation, or treatment in the ED at this time. I have discussed the findings, Dx and Tx plan with the patient/family who expressed understanding and agree(s) with the plan. Discharge instructions discussed at length. The patient/family was given strict return precautions who verbalized understanding of the instructions. No further questions at time of discharge.  Disposition: Discharge  Condition: Good  ED Discharge Orders     None        Follow Up: Bradly Bienenstock, MD [address] [place] [address] [place] [address] [phone]  Call   to schedule an appointment for close follow up           This chart was dictated using voice recognition software.  Despite best efforts to proofread,  errors can occur which can change the documentation meaning.    Nira Conn, MD 01/29/23 [phone]

## 2023-01-29 NOTE — ED Notes (Signed)
Wrist brace given.

## 2023-02-01 DIAGNOSIS — M79642 Pain in left hand: Secondary | ICD-10-CM | POA: Diagnosis not present

## 2023-02-08 ENCOUNTER — Other Ambulatory Visit: Payer: Self-pay

## 2023-02-08 ENCOUNTER — Encounter: Payer: Self-pay | Admitting: Genetics

## 2023-02-08 DIAGNOSIS — R202 Paresthesia of skin: Secondary | ICD-10-CM

## 2023-02-26 ENCOUNTER — Ambulatory Visit (HOSPITAL_COMMUNITY): Payer: Self-pay

## 2023-03-04 ENCOUNTER — Ambulatory Visit (INDEPENDENT_AMBULATORY_CARE_PROVIDER_SITE_OTHER): Payer: 59 | Admitting: Neurology

## 2023-03-04 DIAGNOSIS — R202 Paresthesia of skin: Secondary | ICD-10-CM | POA: Diagnosis not present

## 2023-03-04 DIAGNOSIS — G5603 Carpal tunnel syndrome, bilateral upper limbs: Secondary | ICD-10-CM

## 2023-03-04 NOTE — Procedures (Signed)
[facility]  [address] [place], [address]  [place], [place] [address] Tel: [phone] Fax: [phone] Test Date:  03/04/2023  Patient: Spencer Davis DOB: 1991-06-16 Physician: Jacquelyne Balint, MD  Sex: Male Height: 5\' 9"  Ref Phys: Margart Sickles, PA-C  ID#: [id number]   Technician:    History: This is a 32 year old male with numbness and tingling in his left hand.  NCV & EMG Findings: Extensive electrodiagnostic evaluation of the left upper limb with additional nerve conduction studies of the right upper limb shows: Bilateral median sensory responses show reduced amplitude (L4.2, R4.1 ms) and prolonged distal peak latency (L5, R10 V). Left ulnar and radial sensory responses are within normal limits. Bilateral median (APB) motor responses show prolonged distal onset latency (L4.3, R4.0 ms) and reduced amplitude (L4.5, R5.3 mV). Right ulnar (ADM) motor response is within normal limits. There is no evidence of active or chronic motor axon loss changes affecting any of the tested muscles on needle examination. Motor unit configuration and recruitment pattern is within normal limits.  Impression: This is an abnormal study. The findings are most consistent with the following: Evidence of bilateral median mononeuropathy at or distal to the wrist, consistent with carpal tunnel syndrome, moderate in degree electrically, left worse than right. No electrodiagnostic evidence of a left cervical (C5-T1) motor radiculopathy. No electrodiagnostic evidence of a left ulnar or radial mononeuropathy.  ___________________________ Jacquelyne Balint, MD    Nerve Conduction Studies Motor Nerve Results    Latency Amplitude F-Lat Segment Distance CV Comment  Site (ms) Norm (mV) Norm (ms)  (cm) (m/s) Norm   Left Median (APB) Motor  Wrist *4.3  < 3.9 *4.5  > 6.0        Elbow 9.4 - 3.9 -  Elbow-Wrist 28 55  > 50   Right Median (APB) Motor  Wrist *4.0  < 3.9 *5.3  > 6.0        Left Ulnar (ADM)  Motor  Wrist 1.60  < 3.1 10.0  > 7.0        Bel elbow 5.6 - 8.7 -  Bel elbow-Wrist 23 58  > 50   Ab elbow 7.4 - 8.5 -  Ab elbow-Bel elbow 10 56 -    Sensory Sites    Neg Peak Lat Amplitude (O-P) Segment Distance Velocity Comment  Site (ms) Norm (V) Norm  (cm) (ms)   Left Median Sensory  Wrist-Dig II *4.2  < 3.4 *5  > 20 Wrist-Dig II 13    Right Median Sensory  Wrist-Dig II *4.1  < 3.4 *10  > 20 Wrist-Dig II 13    Left Radial Sensory  Forearm-Wrist 1.88  < 2.7 20  > 18 Forearm-Wrist 10    Left Ulnar Sensory  Wrist-Dig V 2.8  < 3.1 13  > 12 Wrist-Dig V 11     Electromyography   Side Muscle Ins.Act Fibs Fasc Recrt Amp Dur Poly Activation Comment  Left FDI Nml Nml Nml Nml Nml Nml Nml Nml N/A  Left EIP Nml Nml Nml Nml Nml Nml Nml Nml N/A  Left Pronator teres Nml Nml Nml Nml Nml Nml Nml Nml N/A  Left APB Nml Nml Nml Nml Nml Nml Nml Nml N/A  Left Biceps Nml Nml Nml Nml Nml Nml Nml Nml N/A  Left Triceps Nml Nml Nml Nml Nml Nml Nml Nml N/A  Left Deltoid Nml Nml Nml Nml Nml Nml Nml Nml N/A      Waveforms:  Motor  Sensory
